# Patient Record
Sex: Female | Born: 1940 | Race: White | Hispanic: No | Marital: Married | Smoking: Former smoker
Health system: Southern US, Community
[De-identification: ages and names within clinical notes are randomized; demographics above are authoritative.]

## PROBLEM LIST (undated history)

## (undated) DIAGNOSIS — E039 Hypothyroidism, unspecified: Secondary | ICD-10-CM

## (undated) DIAGNOSIS — E7800 Pure hypercholesterolemia, unspecified: Secondary | ICD-10-CM

## (undated) DIAGNOSIS — F329 Major depressive disorder, single episode, unspecified: Secondary | ICD-10-CM

## (undated) DIAGNOSIS — IMO0001 Reserved for inherently not codable concepts without codable children: Secondary | ICD-10-CM

## (undated) DIAGNOSIS — K759 Inflammatory liver disease, unspecified: Secondary | ICD-10-CM

## (undated) DIAGNOSIS — G4730 Sleep apnea, unspecified: Secondary | ICD-10-CM

## (undated) DIAGNOSIS — M1711 Unilateral primary osteoarthritis, right knee: Secondary | ICD-10-CM

## (undated) DIAGNOSIS — R3915 Urgency of urination: Secondary | ICD-10-CM

## (undated) DIAGNOSIS — J189 Pneumonia, unspecified organism: Secondary | ICD-10-CM

## (undated) DIAGNOSIS — F32A Depression, unspecified: Secondary | ICD-10-CM

## (undated) DIAGNOSIS — I341 Nonrheumatic mitral (valve) prolapse: Secondary | ICD-10-CM

## (undated) DIAGNOSIS — N189 Chronic kidney disease, unspecified: Secondary | ICD-10-CM

## (undated) DIAGNOSIS — J329 Chronic sinusitis, unspecified: Secondary | ICD-10-CM

## (undated) DIAGNOSIS — K219 Gastro-esophageal reflux disease without esophagitis: Secondary | ICD-10-CM

## (undated) DIAGNOSIS — M84453A Pathological fracture, unspecified femur, initial encounter for fracture: Secondary | ICD-10-CM

## (undated) DIAGNOSIS — I10 Essential (primary) hypertension: Secondary | ICD-10-CM

## (undated) DIAGNOSIS — F419 Anxiety disorder, unspecified: Secondary | ICD-10-CM

## (undated) HISTORY — PX: SHOULDER SURGERY: SHX246

## (undated) HISTORY — PX: TOE SURGERY: SHX1073

## (undated) HISTORY — PX: KNEE ARTHROSCOPY W/ MENISCECTOMY: SHX1879

## (undated) HISTORY — PX: TONSILLECTOMY: SUR1361

## (undated) HISTORY — PX: COLONOSCOPY: SHX174

## (undated) HISTORY — PX: TUBAL LIGATION: SHX77

## (undated) HISTORY — PX: UVULOPALATOPHARYNGOPLASTY: SHX827

---

## 2000-01-07 ENCOUNTER — Encounter: Admission: RE | Admit: 2000-01-07 | Discharge: 2000-01-07 | Payer: Self-pay | Admitting: Family Medicine

## 2000-01-07 ENCOUNTER — Encounter: Payer: Self-pay | Admitting: Family Medicine

## 2002-02-14 ENCOUNTER — Encounter: Admission: RE | Admit: 2002-02-14 | Discharge: 2002-02-14 | Payer: Self-pay | Admitting: Family Medicine

## 2002-02-14 ENCOUNTER — Encounter: Payer: Self-pay | Admitting: Family Medicine

## 2003-10-11 ENCOUNTER — Ambulatory Visit (HOSPITAL_COMMUNITY): Admission: RE | Admit: 2003-10-11 | Discharge: 2003-10-11 | Payer: Self-pay | Admitting: Gastroenterology

## 2004-04-12 ENCOUNTER — Encounter: Admission: RE | Admit: 2004-04-12 | Discharge: 2004-04-12 | Payer: Self-pay | Admitting: Family Medicine

## 2005-05-22 ENCOUNTER — Encounter: Admission: RE | Admit: 2005-05-22 | Discharge: 2005-05-22 | Payer: Self-pay | Admitting: Family Medicine

## 2006-01-19 ENCOUNTER — Other Ambulatory Visit: Admission: RE | Admit: 2006-01-19 | Discharge: 2006-01-19 | Payer: Self-pay | Admitting: Family Medicine

## 2006-01-24 ENCOUNTER — Emergency Department (HOSPITAL_COMMUNITY): Admission: EM | Admit: 2006-01-24 | Discharge: 2006-01-24 | Payer: Self-pay | Admitting: Emergency Medicine

## 2006-05-28 ENCOUNTER — Encounter: Admission: RE | Admit: 2006-05-28 | Discharge: 2006-05-28 | Payer: Self-pay | Admitting: Family Medicine

## 2007-11-19 ENCOUNTER — Encounter: Admission: RE | Admit: 2007-11-19 | Discharge: 2007-11-19 | Payer: Self-pay | Admitting: Family Medicine

## 2010-05-22 ENCOUNTER — Emergency Department (HOSPITAL_COMMUNITY): Admission: EM | Admit: 2010-05-22 | Discharge: 2010-05-22 | Payer: Self-pay | Admitting: Emergency Medicine

## 2010-07-09 ENCOUNTER — Encounter: Admission: RE | Admit: 2010-07-09 | Discharge: 2010-07-09 | Payer: Self-pay | Admitting: Family Medicine

## 2010-10-05 ENCOUNTER — Encounter
Admission: RE | Admit: 2010-10-05 | Discharge: 2010-10-05 | Payer: Self-pay | Source: Home / Self Care | Attending: Neurological Surgery | Admitting: Neurological Surgery

## 2010-10-20 ENCOUNTER — Encounter: Payer: Self-pay | Admitting: Family Medicine

## 2010-12-13 LAB — POCT I-STAT, CHEM 8
BUN: 21 mg/dL (ref 6–23)
Calcium, Ion: 1.11 mmol/L — ABNORMAL LOW (ref 1.12–1.32)
Chloride: 104 meq/L (ref 96–112)
Creatinine, Ser: 1 mg/dL (ref 0.4–1.2)
Glucose, Bld: 105 mg/dL — ABNORMAL HIGH (ref 70–99)
HCT: 43 % (ref 36.0–46.0)
Hemoglobin: 14.6 g/dL (ref 12.0–15.0)
Potassium: 3.1 meq/L — ABNORMAL LOW (ref 3.5–5.1)
Sodium: 140 meq/L (ref 135–145)
TCO2: 25 mmol/L (ref 0–100)

## 2011-02-14 NOTE — Op Note (Signed)
NAME:  Faith Scott, Faith Scott                         ACCOUNT NO.:  [id number]   MEDICAL RECORD NO.:  [id number]                   PATIENT TYPE:  AMB   LOCATION:  ENDO                                 FACILITY:  [facility]   PHYSICIAN:  John C. Madilyn Fireman, M.D.                 DATE OF BIRTH:  1941/01/24   DATE OF PROCEDURE:  10/11/2003  DATE OF DISCHARGE:                                 OPERATIVE REPORT   PROCEDURE PERFORMED:  Esophagogastroduodenoscopy.   ENDOSCOPIST:  Barrie Folk, M.D.   INDICATIONS FOR PROCEDURE:  Solid food dysphagia.   DESCRIPTION OF PROCEDURE:  The patient was placed in the left lateral  decubitus position and placed on the pulse monitor with continuous low-flow  oxygen delivered by nasal cannula.  She was sedated with 50 mg IV Demerol  and 5 mg IV Versed.  The Olympus video endoscope was advanced under direct  vision into the oropharynx and esophagus.  The esophagus was straight and of  normal caliber with the squamocolumnar line at 38 cm.  There was no visible  hiatal hernia, ring, stricture or other abnormality of the gastroesophageal  junction.  The stomach was entered and a small amount of liquid secretions  was suctioned from the fundus.  A retroflex view of the cardia was  unremarkable.  The fundus, body, antrum and pylorus all appeared normal.  The duodenum was entered and both the bulb and second portion were well  inspected and appeared to be within normal limits.  A Savary guidewire was  placed through the endoscope channel, endoscope withdrawn.  A single 17 mm  Savary dilator was passed over the guidewire with minimal resistance and no  blood seen on withdrawal.  The last dilator was removed together with the  wire and the patient prepared for colonoscopy as previously scheduled.  The  patient tolerated the procedure well.  There were no immediate  complications.   IMPRESSION:  Normal endoscopy, status post empiric dilatation to 17 mm due  to solid food  dysphagia.   PLAN:  Advance diet and observe response to dilatation.                                               John C. Madilyn Fireman, M.D.    JCH/MEDQ  D:  10/11/2003  T:  10/11/2003  Job:  [id number]   cc:   Molly Maduro A. Nicholos Johns, M.D.  [address]  [place]  [place] [address]  Fax: [phone]

## 2011-02-14 NOTE — Op Note (Signed)
NAME:  Faith Scott, Faith Scott                         ACCOUNT NO.:  [id number]   MEDICAL RECORD NO.:  [id number]                   PATIENT TYPE:  AMB   LOCATION:  ENDO                                 FACILITY:  [facility]   PHYSICIAN:  John C. Madilyn Fireman, M.D.                 DATE OF BIRTH:  1941/02/12   DATE OF PROCEDURE:  10/11/2003  DATE OF DISCHARGE:                                 OPERATIVE REPORT   INDICATIONS FOR PROCEDURE:  Rectal bleeding in a 70 year old patient with no  recent colon screening.   DESCRIPTION OF PROCEDURE:  The patient was placed in the left lateral  decubitus position and placed on a pulse monitor with continuous low flow  oxygen delivered by nasal cannula.  She was sedated with 35 mcg of IV  fentanyl and 3 mg of IV Versed in addition to the medicine given for  previous EGD.  The Olympus video colonoscope was inserted into the rectum  and advanced to the cecum, confirmed by transillumination of McBurney's  point and visualization of the ileocecal valve and appendiceal orifice.  Prep was excellent.  The cecum, ascending, transverse, descending, and  sigmoid colon all appeared normal with no masses, polyps, diverticuli, or  other mucosal abnormalities.  The rectum likewise appeared normal on  retroflexed view.  The anus revealed some small internal hemorrhoids.  The  scope was then withdrawn and the patient returned to the recovery room in  stable condition.  She tolerated the procedure well and there were no  immediate complications.   IMPRESSION:  Internal hemorrhoids, otherwise normal study.   PLAN:  Next colon screening plus sigmoidoscopy in five years.  Treat  hemorrhoids symptomatically.                                               John C. Madilyn Fireman, M.D.    JCH/MEDQ  D:  10/11/2003  T:  10/11/2003  Job:  [id number]   cc:   Molly Maduro A. Nicholos Johns, M.D.  [address]  [place]  [place] [address]  Fax: [phone]

## 2011-09-30 HISTORY — PX: EYE SURGERY: SHX253

## 2012-02-12 ENCOUNTER — Other Ambulatory Visit (HOSPITAL_COMMUNITY)
Admission: RE | Admit: 2012-02-12 | Discharge: 2012-02-12 | Disposition: A | Discharge status: Home / Self Care | Payer: Medicare Other | Source: Ambulatory Visit | Attending: Family Medicine | Admitting: Family Medicine

## 2012-02-12 ENCOUNTER — Other Ambulatory Visit: Payer: Self-pay | Admitting: Family Medicine

## 2012-02-12 DIAGNOSIS — Z124 Encounter for screening for malignant neoplasm of cervix: Secondary | ICD-10-CM | POA: Insufficient documentation

## 2012-02-19 ENCOUNTER — Other Ambulatory Visit: Payer: Self-pay | Admitting: Family Medicine

## 2012-02-19 DIAGNOSIS — Z1231 Encounter for screening mammogram for malignant neoplasm of breast: Secondary | ICD-10-CM

## 2012-03-16 ENCOUNTER — Ambulatory Visit
Admission: RE | Admit: 2012-03-16 | Discharge: 2012-03-16 | Disposition: A | Discharge status: Home / Self Care | Payer: Medicare Other | Source: Ambulatory Visit | Attending: Family Medicine | Admitting: Family Medicine

## 2012-03-16 DIAGNOSIS — Z1231 Encounter for screening mammogram for malignant neoplasm of breast: Secondary | ICD-10-CM

## 2012-03-18 ENCOUNTER — Other Ambulatory Visit: Payer: Self-pay | Admitting: Family Medicine

## 2012-03-18 DIAGNOSIS — R928 Other abnormal and inconclusive findings on diagnostic imaging of breast: Secondary | ICD-10-CM

## 2012-03-29 ENCOUNTER — Ambulatory Visit
Admission: RE | Admit: 2012-03-29 | Discharge: 2012-03-29 | Disposition: A | Discharge status: Home / Self Care | Payer: Medicare Other | Source: Ambulatory Visit | Attending: Family Medicine | Admitting: Family Medicine

## 2012-03-29 DIAGNOSIS — R928 Other abnormal and inconclusive findings on diagnostic imaging of breast: Secondary | ICD-10-CM

## 2013-05-20 ENCOUNTER — Ambulatory Visit
Admission: RE | Admit: 2013-05-20 | Discharge: 2013-05-20 | Disposition: A | Discharge status: Home / Self Care | Payer: Medicare Other | Source: Ambulatory Visit | Attending: Internal Medicine | Admitting: Internal Medicine

## 2013-05-20 ENCOUNTER — Other Ambulatory Visit: Payer: Self-pay | Admitting: Internal Medicine

## 2013-05-20 DIAGNOSIS — J328 Other chronic sinusitis: Secondary | ICD-10-CM

## 2013-12-19 ENCOUNTER — Emergency Department (HOSPITAL_COMMUNITY)
Admission: EM | Admit: 2013-12-19 | Discharge: 2013-12-20 | Disposition: A | Discharge status: Home / Self Care | Payer: Medicare Other | Attending: Emergency Medicine | Admitting: Emergency Medicine

## 2013-12-19 ENCOUNTER — Encounter (HOSPITAL_COMMUNITY): Payer: Self-pay | Admitting: Emergency Medicine

## 2013-12-19 DIAGNOSIS — E039 Hypothyroidism, unspecified: Secondary | ICD-10-CM | POA: Insufficient documentation

## 2013-12-19 DIAGNOSIS — Z79899 Other long term (current) drug therapy: Secondary | ICD-10-CM | POA: Insufficient documentation

## 2013-12-19 DIAGNOSIS — H81399 Other peripheral vertigo, unspecified ear: Secondary | ICD-10-CM

## 2013-12-19 DIAGNOSIS — I10 Essential (primary) hypertension: Secondary | ICD-10-CM | POA: Insufficient documentation

## 2013-12-19 DIAGNOSIS — E7800 Pure hypercholesterolemia, unspecified: Secondary | ICD-10-CM | POA: Insufficient documentation

## 2013-12-19 DIAGNOSIS — F411 Generalized anxiety disorder: Secondary | ICD-10-CM | POA: Insufficient documentation

## 2013-12-19 HISTORY — DX: Pure hypercholesterolemia, unspecified: E78.00

## 2013-12-19 HISTORY — DX: Hypothyroidism, unspecified: E03.9

## 2013-12-19 HISTORY — DX: Essential (primary) hypertension: I10

## 2013-12-19 HISTORY — DX: Anxiety disorder, unspecified: F41.9

## 2013-12-19 LAB — CBC WITH DIFFERENTIAL/PLATELET
BASOS PCT: 0 % (ref 0–1)
Basophils Absolute: 0 10*3/uL (ref 0.0–0.1)
EOS ABS: 0.2 10*3/uL (ref 0.0–0.7)
Eosinophils Relative: 2 % (ref 0–5)
HCT: 39.3 % (ref 36.0–46.0)
Hemoglobin: 13.9 g/dL (ref 12.0–15.0)
LYMPHS ABS: 1.6 10*3/uL (ref 0.7–4.0)
Lymphocytes Relative: 20 % (ref 12–46)
MCH: 30.5 pg (ref 26.0–34.0)
MCHC: 35.4 g/dL (ref 30.0–36.0)
MCV: 86.2 fL (ref 78.0–100.0)
Monocytes Absolute: 0.6 10*3/uL (ref 0.1–1.0)
Monocytes Relative: 7 % (ref 3–12)
NEUTROS ABS: 5.6 10*3/uL (ref 1.7–7.7)
NEUTROS PCT: 71 % (ref 43–77)
PLATELETS: 187 10*3/uL (ref 150–400)
RBC: 4.56 MIL/uL (ref 3.87–5.11)
RDW: 13.2 % (ref 11.5–15.5)
WBC: 8 10*3/uL (ref 4.0–10.5)

## 2013-12-19 LAB — I-STAT TROPONIN, ED: Troponin i, poc: 0 ng/mL (ref 0.00–0.08)

## 2013-12-19 MED ORDER — SODIUM CHLORIDE 0.9 % IV SOLN
INTRAVENOUS | Status: DC
  Administered 2013-12-19: via INTRAVENOUS

## 2013-12-19 MED ORDER — DIPHENHYDRAMINE HCL 50 MG/ML IJ SOLN
25.0000 mg | Freq: Once | INTRAMUSCULAR | Status: AC
  Administered 2013-12-19: 25 mg via INTRAVENOUS
  Filled 2013-12-19: qty 1

## 2013-12-19 MED ORDER — ONDANSETRON HCL 4 MG/2ML IJ SOLN
4.0000 mg | Freq: Once | INTRAMUSCULAR | Status: AC
  Administered 2013-12-19: 4 mg via INTRAVENOUS
  Filled 2013-12-19: qty 2

## 2013-12-19 NOTE — ED Notes (Signed)
Pt to ED via EMS for evaluation of nausea and vomiting onset this evening, reports dizziness as well.  Pt vomiting upon arrival to ED, IV in place.

## 2013-12-19 NOTE — ED Provider Notes (Signed)
CSN: [id number]     Arrival date & time 12/19/13  2258 History   First MD Initiated Contact with Patient 12/19/13 2335     Chief Complaint  Patient presents with  . Nausea and Vomiting      (Consider location/radiation/quality/duration/timing/severity/associated sxs/prior Treatment) HPI This is a 73 year old female who developed sudden onset of nausea, vomiting and dizziness about 2 hours ago. On arrival she described the dizziness as the room spinning. She was also having difficulty with coordination. She had one loose stool that preceded the nausea and vomiting. She has no focal neurologic deficit. She was given Zofran IV on arrival but complains of continued nausea although the dizziness has improved.  Past Medical History  Diagnosis Date  . Hypertension   . Hypothyroid   . Hypercholesteremia   . Anxiety    Past Surgical History  Procedure Laterality Date  . Shoulder surgery    . Knee surgery     No family history on file. History  Substance Use Topics  . Smoking status: Not on file  . Smokeless tobacco: Not on file  . Alcohol Use: Not on file   OB History   Grav Para Term Preterm Abortions TAB SAB Ect Mult Living                 Review of Systems  All other systems reviewed and are negative.   Allergies  Calan and Lisinopril  Home Medications   Current Outpatient Rx  Name  Route  Sig  Dispense  Refill  . buPROPion (WELLBUTRIN XL) 150 MG 24 hr tablet   Oral   Take 150 mg by mouth daily.         Marland Kitchen. doxazosin (CARDURA) 4 MG tablet   Oral   Take 4 mg by mouth at bedtime.         . Estradiol-Estriol-Progesterone (BIEST/PROGESTERONE) CREA   Transdermal   Place 1 application onto the skin 2 (two) times daily.         Marland Kitchen. liothyronine (CYTOMEL) 5 MCG tablet   Oral   Take 5 mcg by mouth 2 (two) times daily.         Marland Kitchen. losartan-hydrochlorothiazide (HYZAAR) 100-25 MG per tablet   Oral   Take 1 tablet by mouth daily.          . Magnesium Hydroxide  (MAGNESIA PO)   Oral   Take 1 tablet by mouth 3 (three) times daily.         . Multiple Vitamin (MULTIVITAMIN WITH MINERALS) TABS tablet   Oral   Take 1 tablet by mouth daily.         . Nutritional Supplements (DHEA PO)   Oral   Take 5 mg by mouth daily.         Marland Kitchen. oxybutynin (DITROPAN) 5 MG tablet   Oral   Take 5 mg by mouth 3 (three) times daily.         . potassium chloride SA (K-DUR,KLOR-CON) 20 MEQ tablet   Oral   Take 20 mEq by mouth daily.         . simvastatin (ZOCOR) 40 MG tablet   Oral   Take 40 mg by mouth at bedtime.         . Thyroid (NATURE-THROID PO)   Oral   Take 2 tablets by mouth daily.         Marland Kitchen. VITAMIN D, CHOLECALCIFEROL, PO   Oral   Take 1 tablet by mouth daily.  BP 180/109  Pulse 61  Temp(Src) 97.4 F (36.3 C) (Oral)  Resp 17  SpO2 99%  Physical Exam General: Well-developed, well-nourished female in no acute distress; appearance consistent with age of record HENT: normocephalic; atraumatic Eyes: pupils equal, round and reactive to light; extraocular muscles intact; no nystagmus; arcus senilis bilaterally Neck: supple Heart: regular rate and rhythm Lungs: clear to auscultation bilaterally Abdomen: soft; nondistended; nontender; bowel sounds present Extremities: No deformity; full range of motion; pulses normal Neurologic: Awake, alert and oriented; motor function intact in all extremities and symmetric; no facial droop; normal coordination and speech; negative Romberg; normal finger to nose Skin: Warm and dry Psychiatric: Flat affect    ED Course  Procedures (including critical care time) Nursing notes and vitals signs, including pulse oximetry, reviewed.  Summary of this visit's results, reviewed by myself:  Labs:  Results for orders placed during the hospital encounter of 12/19/13 (from the past 24 hour(s))  CBC WITH DIFFERENTIAL     Status: None   Collection Time    12/19/13 11:35 PM      Result Value Ref  Range   WBC 8.0  4.0 - 10.5 K/uL   RBC 4.56  3.87 - 5.11 MIL/uL   Hemoglobin 13.9  12.0 - 15.0 g/dL   HCT 16.1  09.6 - 04.5 %   MCV 86.2  78.0 - 100.0 fL   MCH 30.5  26.0 - 34.0 pg   MCHC 35.4  30.0 - 36.0 g/dL   RDW 40.9  81.1 - 91.4 %   Platelets 187  150 - 400 K/uL   Neutrophils Relative % 71  43 - 77 %   Neutro Abs 5.6  1.7 - 7.7 K/uL   Lymphocytes Relative 20  12 - 46 %   Lymphs Abs 1.6  0.7 - 4.0 K/uL   Monocytes Relative 7  3 - 12 %   Monocytes Absolute 0.6  0.1 - 1.0 K/uL   Eosinophils Relative 2  0 - 5 %   Eosinophils Absolute 0.2  0.0 - 0.7 K/uL   Basophils Relative 0  0 - 1 %   Basophils Absolute 0.0  0.0 - 0.1 K/uL  COMPREHENSIVE METABOLIC PANEL     Status: Abnormal   Collection Time    12/19/13 11:35 PM      Result Value Ref Range   Sodium 144  137 - 147 mEq/L   Potassium 3.6 (*) 3.7 - 5.3 mEq/L   Chloride 103  96 - 112 mEq/L   CO2 27  19 - 32 mEq/L   Glucose, Bld 123 (*) 70 - 99 mg/dL   BUN 27 (*) 6 - 23 mg/dL   Creatinine, Ser 7.82  0.50 - 1.10 mg/dL   Calcium 9.1  8.4 - 95.6 mg/dL   Total Protein 6.3  6.0 - 8.3 g/dL   Albumin 3.6  3.5 - 5.2 g/dL   AST 23  0 - 37 U/L   ALT 20  0 - 35 U/L   Alkaline Phosphatase 61  39 - 117 U/L   Total Bilirubin 0.3  0.3 - 1.2 mg/dL   GFR calc non Af Amer 71 (*) >90 mL/min   GFR calc Af Amer 82 (*) >90 mL/min  LIPASE, BLOOD     Status: None   Collection Time    12/19/13 11:35 PM      Result Value Ref Range   Lipase 35  11 - 59 U/L  Rosezena Sensor, ED  Status: None   Collection Time    12/19/13 11:37 PM      Result Value Ref Range   Troponin i, poc 0.00  0.00 - 0.08 ng/mL   Comment 3            1:26 AM Patient symptoms much improved after IV Benadryl. Patient has been able to ambulate without difficulty. History and exam consistent with peripheral vertigo.     Hanley Seamen, MD 12/20/13 [phone]

## 2013-12-20 LAB — COMPREHENSIVE METABOLIC PANEL
ALBUMIN: 3.6 g/dL (ref 3.5–5.2)
ALK PHOS: 61 U/L (ref 39–117)
ALT: 20 U/L (ref 0–35)
AST: 23 U/L (ref 0–37)
BUN: 27 mg/dL — ABNORMAL HIGH (ref 6–23)
CO2: 27 mEq/L (ref 19–32)
Calcium: 9.1 mg/dL (ref 8.4–10.5)
Chloride: 103 mEq/L (ref 96–112)
Creatinine, Ser: 0.82 mg/dL (ref 0.50–1.10)
GFR calc Af Amer: 82 mL/min — ABNORMAL LOW (ref >90)
GFR calc non Af Amer: 71 mL/min — ABNORMAL LOW (ref >90)
Glucose, Bld: 123 mg/dL — ABNORMAL HIGH (ref 70–99)
POTASSIUM: 3.6 meq/L — AB (ref 3.7–5.3)
SODIUM: 144 meq/L (ref 137–147)
TOTAL PROTEIN: 6.3 g/dL (ref 6.0–8.3)
Total Bilirubin: 0.3 mg/dL (ref 0.3–1.2)

## 2013-12-20 LAB — LIPASE, BLOOD: Lipase: 35 U/L (ref 11–59)

## 2013-12-20 MED ORDER — MECLIZINE HCL 25 MG PO TABS: 25.0000 mg | ORAL_TABLET | Freq: Three times a day (TID) | ORAL | Status: DC | PRN

## 2013-12-20 NOTE — ED Notes (Signed)
Pt ambulatory to restroom without difficulty, denies nausea or dizziness

## 2013-12-20 NOTE — Discharge Instructions (Signed)
Vertigo Vertigo means you feel like you or your surroundings are moving when they are not. Vertigo can be dangerous if it occurs when you are at work, driving, or performing difficult activities.  CAUSES  Vertigo occurs when there is a conflict of signals sent to your brain from the visual and sensory systems in your body. There are many different causes of vertigo, including:  Infections, especially in the inner ear.  A bad reaction to a drug or misuse of alcohol and medicines.  Withdrawal from drugs or alcohol.  Rapidly changing positions, such as lying down or rolling over in bed.  A migraine headache.  Decreased blood flow to the brain.  Increased pressure in the brain from a head injury, infection, tumor, or bleeding. SYMPTOMS  You may feel as though the world is spinning around or you are falling to the ground. Because your balance is upset, vertigo can cause nausea and vomiting. You may have involuntary eye movements (nystagmus). DIAGNOSIS  Vertigo is usually diagnosed by physical exam. If the cause of your vertigo is unknown, your caregiver may perform imaging tests, such as an MRI scan (magnetic resonance imaging). TREATMENT  Most cases of vertigo resolve on their own, without treatment. Depending on the cause, your caregiver may prescribe certain medicines. If your vertigo is related to body position issues, your caregiver may recommend movements or procedures to correct the problem. In rare cases, if your vertigo is caused by certain inner ear problems, you may need surgery. HOME CARE INSTRUCTIONS   Follow your caregiver's instructions.  Avoid driving.  Avoid operating heavy machinery.  Avoid performing any tasks that would be dangerous to you or others during a vertigo episode.  Tell your caregiver if you notice that certain medicines seem to be causing your vertigo. Some of the medicines used to treat vertigo episodes can actually make them worse in some people. SEEK  IMMEDIATE MEDICAL CARE IF:   Your medicines do not relieve your vertigo or are making it worse.  You develop problems with talking, walking, weakness, or using your arms, hands, or legs.  You develop severe headaches.  Your nausea or vomiting continues or gets worse.  You develop visual changes.  A family member notices behavioral changes.  Your condition gets worse. MAKE SURE YOU:  Understand these instructions.  Will watch your condition.  Will get help right away if you are not doing well or get worse. Document Released: 06/25/2005 Document Revised: 12/08/2011 Document Reviewed: 04/03/2011 Exit[employer] Patient Information 2014 [employer].  

## 2014-07-05 ENCOUNTER — Emergency Department (HOSPITAL_COMMUNITY)
Admission: EM | Admit: 2014-07-05 | Discharge: 2014-07-05 | Disposition: A | Discharge status: Home / Self Care | Payer: Medicare Other | Attending: Emergency Medicine | Admitting: Emergency Medicine

## 2014-07-05 ENCOUNTER — Encounter (HOSPITAL_COMMUNITY): Payer: Self-pay | Admitting: Emergency Medicine

## 2014-07-05 ENCOUNTER — Emergency Department (HOSPITAL_COMMUNITY): Payer: Medicare Other

## 2014-07-05 DIAGNOSIS — R109 Unspecified abdominal pain: Secondary | ICD-10-CM | POA: Insufficient documentation

## 2014-07-05 DIAGNOSIS — Z79899 Other long term (current) drug therapy: Secondary | ICD-10-CM | POA: Diagnosis not present

## 2014-07-05 DIAGNOSIS — R110 Nausea: Secondary | ICD-10-CM | POA: Insufficient documentation

## 2014-07-05 DIAGNOSIS — E782 Mixed hyperlipidemia: Secondary | ICD-10-CM | POA: Insufficient documentation

## 2014-07-05 DIAGNOSIS — R0789 Other chest pain: Secondary | ICD-10-CM | POA: Diagnosis not present

## 2014-07-05 DIAGNOSIS — I10 Essential (primary) hypertension: Secondary | ICD-10-CM | POA: Diagnosis not present

## 2014-07-05 DIAGNOSIS — M549 Dorsalgia, unspecified: Secondary | ICD-10-CM | POA: Diagnosis not present

## 2014-07-05 DIAGNOSIS — Z87891 Personal history of nicotine dependence: Secondary | ICD-10-CM | POA: Insufficient documentation

## 2014-07-05 DIAGNOSIS — E039 Hypothyroidism, unspecified: Secondary | ICD-10-CM | POA: Diagnosis not present

## 2014-07-05 DIAGNOSIS — F419 Anxiety disorder, unspecified: Secondary | ICD-10-CM | POA: Insufficient documentation

## 2014-07-05 LAB — URINALYSIS, ROUTINE W REFLEX MICROSCOPIC
Bilirubin Urine: NEGATIVE
GLUCOSE, UA: NEGATIVE mg/dL
HGB URINE DIPSTICK: NEGATIVE
Ketones, ur: NEGATIVE mg/dL
Leukocytes, UA: NEGATIVE
Nitrite: NEGATIVE
PROTEIN: NEGATIVE mg/dL
Specific Gravity, Urine: 1.019 (ref 1.005–1.030)
Urobilinogen, UA: 0.2 mg/dL (ref 0.0–1.0)
pH: 6 (ref 5.0–8.0)

## 2014-07-05 LAB — COMPREHENSIVE METABOLIC PANEL
ALK PHOS: 66 U/L (ref 39–117)
ALT: 18 U/L (ref 0–35)
AST: 20 U/L (ref 0–37)
Albumin: 3.8 g/dL (ref 3.5–5.2)
Anion gap: 14 (ref 5–15)
BILIRUBIN TOTAL: 0.6 mg/dL (ref 0.3–1.2)
BUN: 22 mg/dL (ref 6–23)
CO2: 25 mEq/L (ref 19–32)
Calcium: 9 mg/dL (ref 8.4–10.5)
Chloride: 101 mEq/L (ref 96–112)
Creatinine, Ser: 0.72 mg/dL (ref 0.50–1.10)
GFR calc non Af Amer: 84 mL/min — ABNORMAL LOW (ref >90)
Glucose, Bld: 91 mg/dL (ref 70–99)
POTASSIUM: 3.2 meq/L — AB (ref 3.7–5.3)
Sodium: 140 mEq/L (ref 137–147)
TOTAL PROTEIN: 7.1 g/dL (ref 6.0–8.3)

## 2014-07-05 LAB — CBC WITH DIFFERENTIAL/PLATELET
Basophils Absolute: 0 10*3/uL (ref 0.0–0.1)
Basophils Relative: 1 % (ref 0–1)
EOS ABS: 0.2 10*3/uL (ref 0.0–0.7)
Eosinophils Relative: 3 % (ref 0–5)
HEMATOCRIT: 41.5 % (ref 36.0–46.0)
HEMOGLOBIN: 14.7 g/dL (ref 12.0–15.0)
Lymphocytes Relative: 29 % (ref 12–46)
Lymphs Abs: 1.5 10*3/uL (ref 0.7–4.0)
MCH: 29.9 pg (ref 26.0–34.0)
MCHC: 35.4 g/dL (ref 30.0–36.0)
MCV: 84.3 fL (ref 78.0–100.0)
MONO ABS: 0.3 10*3/uL (ref 0.1–1.0)
MONOS PCT: 7 % (ref 3–12)
Neutro Abs: 3.2 10*3/uL (ref 1.7–7.7)
Neutrophils Relative %: 60 % (ref 43–77)
Platelets: 202 10*3/uL (ref 150–400)
RBC: 4.92 MIL/uL (ref 3.87–5.11)
RDW: 12.7 % (ref 11.5–15.5)
WBC: 5.3 10*3/uL (ref 4.0–10.5)

## 2014-07-05 LAB — LIPASE, BLOOD: LIPASE: 40 U/L (ref 11–59)

## 2014-07-05 LAB — I-STAT CG4 LACTIC ACID, ED: Lactic Acid, Venous: 1.47 mmol/L (ref 0.5–2.2)

## 2014-07-05 MED ORDER — MORPHINE SULFATE 4 MG/ML IJ SOLN
4.0000 mg | Freq: Once | INTRAMUSCULAR | Status: AC
  Administered 2014-07-05: 4 mg via INTRAVENOUS
  Filled 2014-07-05: qty 1

## 2014-07-05 MED ORDER — ACYCLOVIR 800 MG PO TABS: 800.0000 mg | ORAL_TABLET | Freq: Every day | ORAL | Status: DC

## 2014-07-05 MED ORDER — ONDANSETRON HCL 4 MG/2ML IJ SOLN
4.0000 mg | Freq: Once | INTRAMUSCULAR | Status: AC
  Administered 2014-07-05: 4 mg via INTRAVENOUS
  Filled 2014-07-05: qty 2

## 2014-07-05 MED ORDER — OXYCODONE-ACETAMINOPHEN 5-325 MG PO TABS: 1.0000 | ORAL_TABLET | Freq: Four times a day (QID) | ORAL | Status: DC | PRN

## 2014-07-05 NOTE — ED Notes (Signed)
MD at bedside. 

## 2014-07-05 NOTE — ED Notes (Signed)
MD at bedside. EDP HORTON 

## 2014-07-05 NOTE — ED Notes (Addendum)
Pt c/o LUQ abdominal pain radiating into back and L shoulder blade, SOB, and intermittent nausea x 4 days.  Pain score 5/10.  Pt reports pain upon palpation.  Pt reports lying flat makes it "a little better."

## 2014-07-05 NOTE — Discharge Instructions (Signed)
The exact cause of your pain is unknown at this time.  You have a few lesions on your side that could be early shingles - but not diagnostic.   You need to follow-up with your doctor in 1-2 days if symptoms worsen or rash appears.  Flank Pain Flank pain refers to pain that is located on the side of the body between the upper abdomen and the back. The pain may occur over a short period of time (acute) or may be long-term or reoccurring (chronic). It may be mild or severe. Flank pain can be caused by many things. CAUSES  Some of the more common causes of flank pain include:  Muscle strains.   Muscle spasms.   A disease of your spine (vertebral disk disease).   A lung infection (pneumonia).   Fluid around your lungs (pulmonary edema).   A kidney infection.   Kidney stones.   A very painful skin rash caused by the chickenpox virus (shingles).   Gallbladder disease.  HOME CARE INSTRUCTIONS  Home care will depend on the cause of your pain. In general,  Rest as directed by your caregiver.  Drink enough fluids to keep your urine clear or pale yellow.  Only take over-the-counter or prescription medicines as directed by your caregiver. Some medicines may help relieve the pain.  Tell your caregiver about any changes in your pain.  Follow up with your caregiver as directed. SEEK IMMEDIATE MEDICAL CARE IF:   Your pain is not controlled with medicine.   You have new or worsening symptoms.  Your pain increases.   You have abdominal pain.   You have shortness of breath.   You have persistent nausea or vomiting.   You have swelling in your abdomen.   You feel faint or pass out.   You have blood in your urine.  You have a fever or persistent symptoms for more than 2-3 days.  You have a fever and your symptoms suddenly get worse. MAKE SURE YOU:   Understand these instructions.  Will watch your condition.  Will get help right away if you are not doing well  or get worse. Document Released: 11/06/2005 Document Revised: 06/09/2012 Document Reviewed: 04/29/2012 [facility] Patient Information 2015 [employer], [place]. This information is not intended to replace advice given to you by your health care provider. Make sure you discuss any questions you have with your health care provider.

## 2014-07-05 NOTE — ED Notes (Signed)
Patient transported to X-ray 

## 2014-07-05 NOTE — ED Provider Notes (Signed)
CSN: [id number]     Arrival date & time 07/05/14  [id number] History   First MD Initiated Contact with Patient 07/05/14 1009     Chief Complaint  Patient presents with  . Abdominal Pain  . Shortness of Breath     (Consider location/radiation/quality/duration/timing/severity/associated sxs/prior Treatment) HPI  This is a 73 year old female with a history of hypertension, hypercholesterolemia who presents with a 3 to four-day history of worsening left chest, flank, and back pain. She reports sharp and mostly in her left upper quadrant and lower chest. It radiates around her back. She does not not note any association with food, exertion, or breathing. Nothing makes it better or worse but she states that even her shirt touching this area increases the pain. She hasn't tried anything at home. She denies any shortness of breath, diarrhea, vomiting. She does report "waves of nausea." She reports the pain is constant. Currently it is 4/10 but it does worsen at times. She denies any injury. She has not noted any rash. Denies any urinary symptoms.  Past Medical History  Diagnosis Date  . Hypertension   . Hypothyroid   . Hypercholesteremia   . Anxiety    Past Surgical History  Procedure Laterality Date  . Shoulder surgery    . Knee surgery     History reviewed. No pertinent family history. History  Substance Use Topics  . Smoking status: Former Games developermoker  . Smokeless tobacco: Not on file  . Alcohol Use: Yes   OB History   Grav Para Term Preterm Abortions TAB SAB Ect Mult Living                 Review of Systems  Constitutional: Negative for fever.  Respiratory: Positive for chest tightness. Negative for cough and shortness of breath.   Cardiovascular: Positive for chest pain. Negative for leg swelling.  Gastrointestinal: Positive for nausea. Negative for vomiting, abdominal pain, diarrhea and constipation.  Genitourinary: Negative for dysuria.  Musculoskeletal: Positive for back pain.   Skin: Negative for rash.  Neurological: Negative for headaches.  All other systems reviewed and are negative.     Allergies  Calan and Lisinopril  Home Medications   Prior to Admission medications   Medication Sig Start Date End Date Taking? Authorizing Provider  5-HYDROXYTRYPTOPHAN PO Take 1 tablet by mouth daily.    Yes Historical Provider, MD  amLODipine (NORVASC) 5 MG tablet Take 5 mg by mouth daily.   Yes Historical Provider, MD  Biotin 10 MG CAPS Take 10 mg by mouth daily.   Yes Historical Provider, MD  doxazosin (CARDURA) 4 MG tablet Take 4 mg by mouth at bedtime.   Yes Historical Provider, MD  Estradiol-Estriol-Progesterone (BIEST/PROGESTERONE) CREA Place 1 application onto the skin 2 (two) times daily.   Yes Historical Provider, MD  GLYCINE PO Take 1 capsule by mouth daily.    Yes Historical Provider, MD  liothyronine (CYTOMEL) 5 MCG tablet Take 5 mcg by mouth daily.   Yes Historical Provider, MD  losartan-hydrochlorothiazide (HYZAAR) 100-25 MG per tablet Take 1 tablet by mouth daily.  10/13/13  Yes Historical Provider, MD  MAGNESIUM GLYCINATE PLUS PO Take 1 tablet by mouth daily.   Yes Historical Provider, MD  Multiple Vitamin (MULTIVITAMIN WITH MINERALS) TABS tablet Take 1 tablet by mouth daily.   Yes Historical Provider, MD  Nutritional Supplements (DHEA PO) Take 5 mg by mouth daily.   Yes Historical Provider, MD  oxybutynin (DITROPAN) 5 MG tablet Take 5 mg by mouth 3 (  three) times daily.   Yes Historical Provider, MD  potassium chloride SA (K-DUR,KLOR-CON) 20 MEQ tablet Take 20 mEq by mouth daily.   Yes Historical Provider, MD  Selenium (SELENOMAX PO) Take 1 tablet by mouth daily.    Yes Historical Provider, MD  simvastatin (ZOCOR) 40 MG tablet Take 40 mg by mouth at bedtime.   Yes Historical Provider, MD  Thyroid (NATURE-THROID PO) Take 2 tablets by mouth daily.   Yes Historical Provider, MD  VITAMIN D, CHOLECALCIFEROL, PO Take 1 tablet by mouth daily.   Yes Historical  Provider, MD  acyclovir (ZOVIRAX) 800 MG tablet Take 1 tablet (800 mg total) by mouth 5 (five) times daily. 07/05/14   Shon Baton, MD  oxyCODONE-acetaminophen (PERCOCET/ROXICET) 5-325 MG per tablet Take 1 tablet by mouth every 6 (six) hours as needed for moderate pain or severe pain. 07/05/14   Shon Baton, MD   BP 150/93  Pulse 64  Temp(Src) 97.7 F (36.5 C) (Oral)  Resp 16  SpO2 97% Physical Exam  Nursing note and vitals reviewed. Constitutional: She is oriented to person, place, and time. She appears well-developed and well-nourished. No distress.  HENT:  Head: Normocephalic and atraumatic.  Eyes: Pupils are equal, round, and reactive to light.  Cardiovascular: Normal rate, regular rhythm and normal heart sounds.   No murmur heard. Pulmonary/Chest: Effort normal and breath sounds normal. No respiratory distress. She has no wheezes.  Abdominal: Soft. Bowel sounds are normal. There is tenderness. There is no rebound and no guarding.  Tenderness to palpation in the left upper quadrant and left flank, no signs of peritonitis  Neurological: She is alert and oriented to person, place, and time.  Skin: Skin is warm and dry.  6 punctate areas of erythema noted in the distribution of the tenderness over the left upper quadrant, no vesicles noted, no lesions noted on the back  Psychiatric: She has a normal mood and affect.    ED Course  Procedures (including critical care time) Labs Review Labs Reviewed  COMPREHENSIVE METABOLIC PANEL - Abnormal; Notable for the following:    Potassium 3.2 (*)    GFR calc non Af Amer 84 (*)    All other components within normal limits  CBC WITH DIFFERENTIAL  LIPASE, BLOOD  URINALYSIS, ROUTINE W REFLEX MICROSCOPIC  I-STAT CG4 LACTIC ACID, ED  I-STAT TROPOININ, ED    Imaging Review Dg Abd Acute W/chest  07/05/2014   CLINICAL DATA:  Left upper quadrant pain. Shortness of breath. Intermittent nausea.  EXAM: ACUTE ABDOMEN SERIES (ABDOMEN 2  VIEW & CHEST 1 VIEW)  COMPARISON:  Chest x-ray 07/21/2011.  FINDINGS: Mild left lower lobe infiltrate cannot be excluded. Chest is otherwise unremarkable.  Soft tissue structures of the abdomen are unremarkable. Gas pattern nonspecific. Stool noted throughout the colon. Calcified pelvic densities consistent with phleboliths. Distal ureteral stone cannot be excluded. Aortoiliac atherosclerotic vascular disease. Degenerative changes lumbar spine and both hips.  IMPRESSION: 1. Mild left lower lobe pulmonary infiltrate cannot be excluded. 2. No acute intra-abdominal abnormality.   Electronically Signed   By: Maisie Fus  Register   On: 07/05/2014 12:09     EKG Interpretation   Date/Time:  Wednesday July 05 2014 10:15:36 EDT Ventricular Rate:  62 PR Interval:  162 QRS Duration: 99 QT Interval:  447 QTC Calculation: 454 R Axis:   -53 Text Interpretation:  Sinus rhythm Left axis deviation RSR' in V1 or V2,  right VCD or RVH No prior for comparison Confirmed by [facility]  MD, Toni Amend  ([id number]) on 07/05/2014 10:38:55 AM      MDM   Final diagnoses:  Flank pain   Patient presents with left-sided flank and chest pain. Vital signs are reassuring. Pain is reproducible on exam. She does have several small areas of punctate erythema in a dermatomal distribution. While these areas are not vesicular, physical exam and history are suspicious for prodrome for shingles. EKG, chest x-ray obtained and largely reassuring. There is a questionable left lower lobe infiltrate. Patient has not had any fevers or cough and clinically I have low suspicion for pneumonia.  All the workup is negative. Discuss with patient that this could be early shingles and she should monitor for worsening pain or rash. She was given pain medication. Acyclovir in the event that she does develop a more prominent rash as this will need to be started as soon as possible for best effect. She will followup with her primary care physician by  Friday.  After history, exam, and medical workup I feel the patient has been appropriately medically screened and is safe for discharge home. Pertinent diagnoses were discussed with the patient. Patient was given return precautions.     Shon Baton, MD 07/06/14 [phone]

## 2015-01-25 ENCOUNTER — Ambulatory Visit: Payer: Medicare Other | Admitting: Podiatry

## 2015-03-07 ENCOUNTER — Encounter (HOSPITAL_COMMUNITY): Payer: Self-pay | Admitting: Physician Assistant

## 2015-03-07 DIAGNOSIS — I10 Essential (primary) hypertension: Secondary | ICD-10-CM | POA: Diagnosis present

## 2015-03-07 DIAGNOSIS — F419 Anxiety disorder, unspecified: Secondary | ICD-10-CM | POA: Diagnosis present

## 2015-03-07 DIAGNOSIS — E7800 Pure hypercholesterolemia, unspecified: Secondary | ICD-10-CM | POA: Diagnosis present

## 2015-03-07 DIAGNOSIS — M1711 Unilateral primary osteoarthritis, right knee: Secondary | ICD-10-CM | POA: Diagnosis present

## 2015-03-07 NOTE — H&P (Addendum)
TOTAL KNEE ADMISSION H&P  Patient is being admitted for right total knee arthroplasty.  Subjective:  Chief Complaint:right knee pain.  HPI: Faith Scott, 74 y.o. female, has a history of pain and functional disability in the right knee due to arthritis and has failed non-surgical conservative treatments for greater than 12 weeks to includeNSAID's and/or analgesics, corticosteriod injections, viscosupplementation injections, flexibility and strengthening excercises, supervised PT with diminished ADL's post treatment, use of assistive devices and activity modification.  Onset of symptoms was gradual, starting 10 years ago with gradually worsening course since that time. The patient noted prior procedures on the knee to include  arthroscopy and menisectomy on the right knee(s).  Patient currently rates pain in the right knee(s) at 10 out of 10 with activity. Patient has night pain, worsening of pain with activity and weight bearing, pain that interferes with activities of daily living, crepitus and joint swelling.  Patient has evidence of subchondral sclerosis, periarticular osteophytes and joint space narrowing by imaging studies. There is no active infection.  Patient Active Problem List   Diagnosis Date Noted  . Primary localized osteoarthritis of right knee   . Anxiety   . Hypercholesteremia   . Hypertension    Past Medical History  Diagnosis Date  . Hypertension   . Hypothyroid     resolved as of 03/07/15 no medications anymore  . Hypercholesteremia   . Anxiety   . Primary localized osteoarthritis of right knee     Past Surgical History  Procedure Laterality Date  . Shoulder surgery Left   . Knee arthroscopy w/ meniscectomy Right   . Shoulder surgery Right   . Tubal ligation    . Uvulopalatopharyngoplasty      no CPAP needed now    No current facility-administered medications for this encounter.  Current outpatient prescriptions:  .  5-HYDROXYTRYPTOPHAN PO, Take 1 tablet by  mouth daily. , Disp: , Rfl:  .  Biotin 10 MG CAPS, Take 10 mg by mouth daily., Disp: , Rfl:  .  doxazosin (CARDURA) 4 MG tablet, Take 4 mg by mouth at bedtime., Disp: , Rfl:  .  Estradiol-Estriol-Progesterone (BIEST/PROGESTERONE) CREA, Place 1 application onto the skin 2 (two) times daily., Disp: , Rfl:  .  GLYCINE PO, Take 1 capsule by mouth daily. , Disp: , Rfl:  .  losartan-hydrochlorothiazide (HYZAAR) 100-25 MG per tablet, Take 1 tablet by mouth daily. , Disp: , Rfl:  .  MAGNESIUM GLYCINATE PLUS PO, Take 1 tablet by mouth daily., Disp: , Rfl:  .  Multiple Vitamin (MULTIVITAMIN WITH MINERALS) TABS tablet, Take 1 tablet by mouth daily., Disp: , Rfl:  .  Nutritional Supplements (DHEA PO), Take 5 mg by mouth daily., Disp: , Rfl:  .  oxybutynin (DITROPAN) 5 MG tablet, Take 5 mg by mouth 3 (three) times daily., Disp: , Rfl:  .  pantoprazole (PROTONIX) 40 MG tablet, Take 40 mg by mouth every morning., Disp: , Rfl: 5 .  Probiotic Product (PROBIOTIC & ACIDOPHILUS EX ST PO), Take 1 tablet by mouth., Disp: , Rfl:  .  ranitidine (ZANTAC) 300 MG tablet, Take 300 mg by mouth at bedtime., Disp: , Rfl: 5 .  vitamin B-12 (CYANOCOBALAMIN) 1000 MCG tablet, Take 1,000 mcg by mouth daily., Disp: , Rfl:  .  VITAMIN D, CHOLECALCIFEROL, PO, Take 1 tablet by mouth daily., Disp: , Rfl:  .  acyclovir (ZOVIRAX) 800 MG tablet, Take 1 tablet (800 mg total) by mouth 5 (five) times daily., Disp: 50 tablet, Rfl:  0 .  oxyCODONE-acetaminophen (PERCOCET/ROXICET) 5-325 MG per tablet, Take 1 tablet by mouth every 6 (six) hours as needed for moderate pain or severe pain., Disp: 15 tablet, Rfl: 0 .  potassium chloride SA (K-DUR,KLOR-CON) 20 MEQ tablet, Take 20 mEq by mouth daily., Disp: , Rfl:  Allergies  Allergen Reactions  . Calan [Verapamil] Other (See Comments)    Hair loss  . Lisinopril Cough    History  Substance Use Topics  . Smoking status: Former Games developer  . Smokeless tobacco: Not on file  . Alcohol Use: 0.0 oz/week     0 Standard drinks or equivalent per week     Comment: 2 times a month    Family History  Problem Relation Age of Onset  . Hypertension Mother   . Stroke Mother   . Clotting disorder Sister     sister died at age 24 of a postoperative blood clot  . Clotting disorder Mother     mother had a stoke postopertively     Review of Systems  Constitutional: Negative.   HENT: Positive for congestion. Negative for ear discharge, ear pain, hearing loss, nosebleeds, sore throat and tinnitus.   Eyes: Negative.   Respiratory: Negative.  Negative for stridor.   Cardiovascular: Negative.   Gastrointestinal: Negative.   Genitourinary: Negative.   Musculoskeletal: Positive for joint pain.       Right knee  Skin: Negative.   Neurological: Negative.  Negative for headaches.  Endo/Heme/Allergies: Negative.   Psychiatric/Behavioral: Negative.     Objective:  Physical Exam  Constitutional: She is oriented to person, place, and time. She appears well-developed and well-nourished.  HENT:  Head: Normocephalic and atraumatic.  Mouth/Throat: Oropharynx is clear and moist.  Eyes: Conjunctivae and EOM are normal. Pupils are equal, round, and reactive to light.  Neck: Neck supple.  Cardiovascular: Normal rate and regular rhythm.   Respiratory: Effort normal and breath sounds normal.  GI: Soft. Bowel sounds are normal.  Genitourinary:  Not pertinent to current symptomatology therefore not examined.  Musculoskeletal:  Examination of her right knee reveals a moderate valgus deformity. Diffuse pain. There is 1+ crepitation. Full range of motion.  The knee is stable with normal patella tracking. Examination of the left knee reveals a full range of motion without pain, swelling weakness or instability. Vascular exam: pulses 2+ and symmetric.   Neurological: She is alert and oriented to person, place, and time.  Skin: Skin is warm and dry.  Psychiatric: She has a normal mood and affect. Her behavior is  normal.    Vital signs in last 24 hours: Temp:  [98.9 F (37.2 C)] 98.9 F (37.2 C) (06/08 1400) Pulse Rate:  [77] 77 (06/08 1400) BP: (109)/(74) 109/74 mmHg (06/08 1400) SpO2:  [95 %] 95 % (06/08 1400) Weight:  [83.915 kg (185 lb)] 83.915 kg (185 lb) (06/08 1400)  Labs:   Estimated body mass index is 31.74 kg/(m^2) as calculated from the following:   Height as of this encounter:  (1.626 m).   Weight as of this encounter: 83.915 kg (185 lb).   Imaging Review Plain radiographs demonstrate severe degenerative joint disease of the right knee(s). The overall alignment issignificant valgus. The bone quality appears to be good for age and reported activity level.  Assessment/Plan:  End stage arthritis, right knee  Active Problems:   Primary localized osteoarthritis of right knee   Anxiety   Hypercholesteremia   Hypertension    The patient history, physical examination, clinical judgment of  the provider and imaging studies are consistent with end stage degenerative joint disease of the right knee(s) and total knee arthroplasty is deemed medically necessary. The treatment options including medical management, injection therapy arthroscopy and arthroplasty were discussed at length. The risks and benefits of total knee arthroplasty were presented and reviewed. The risks due to aseptic loosening, infection, stiffness, patella tracking problems, thromboembolic complications and other imponderables were discussed. The patient acknowledged the explanation, agreed to proceed with the plan and consent was signed. Patient is being admitted for inpatient treatment for surgery, pain control, PT, OT, prophylactic antibiotics, VTE prophylaxis, progressive ambulation and ADL's and discharge planning. The patient is planning to be discharged home with home health services   Itzamar Traynor A. Gwinda PasseShepperson, PA-C Physician Assistant [facility] [phone]  03/07/2015, 2:44 PM

## 2015-03-08 ENCOUNTER — Encounter (HOSPITAL_COMMUNITY): Payer: Self-pay

## 2015-03-08 ENCOUNTER — Encounter (HOSPITAL_COMMUNITY)
Admission: RE | Admit: 2015-03-08 | Discharge: 2015-03-08 | Disposition: A | Discharge status: Home / Self Care | Payer: Medicare Other | Source: Ambulatory Visit | Attending: Orthopedic Surgery | Admitting: Orthopedic Surgery

## 2015-03-08 ENCOUNTER — Encounter (HOSPITAL_COMMUNITY): Payer: Self-pay | Admitting: Emergency Medicine

## 2015-03-08 DIAGNOSIS — M179 Osteoarthritis of knee, unspecified: Secondary | ICD-10-CM

## 2015-03-08 DIAGNOSIS — Z01818 Encounter for other preprocedural examination: Secondary | ICD-10-CM | POA: Insufficient documentation

## 2015-03-08 HISTORY — DX: Sleep apnea, unspecified: G47.30

## 2015-03-08 HISTORY — DX: Pneumonia, unspecified organism: J18.9

## 2015-03-08 HISTORY — DX: Reserved for inherently not codable concepts without codable children: IMO0001

## 2015-03-08 HISTORY — DX: Chronic sinusitis, unspecified: J32.9

## 2015-03-08 HISTORY — DX: Urgency of urination: R39.15

## 2015-03-08 HISTORY — DX: Gastro-esophageal reflux disease without esophagitis: K21.9

## 2015-03-08 HISTORY — DX: Inflammatory liver disease, unspecified: K75.9

## 2015-03-08 HISTORY — DX: Chronic kidney disease, unspecified: N18.9

## 2015-03-08 HISTORY — DX: Nonrheumatic mitral (valve) prolapse: I34.1

## 2015-03-08 LAB — URINALYSIS, ROUTINE W REFLEX MICROSCOPIC
BILIRUBIN URINE: NEGATIVE
GLUCOSE, UA: NEGATIVE mg/dL
Hgb urine dipstick: NEGATIVE
Ketones, ur: NEGATIVE mg/dL
Leukocytes, UA: NEGATIVE
Nitrite: NEGATIVE
PH: 6 (ref 5.0–8.0)
Protein, ur: NEGATIVE mg/dL
Specific Gravity, Urine: 1.024 (ref 1.005–1.030)
Urobilinogen, UA: 0.2 mg/dL (ref 0.0–1.0)

## 2015-03-08 LAB — CBC WITH DIFFERENTIAL/PLATELET
Basophils Absolute: 0 10*3/uL (ref 0.0–0.1)
Basophils Relative: 1 % (ref 0–1)
EOS PCT: 3 % (ref 0–5)
Eosinophils Absolute: 0.2 10*3/uL (ref 0.0–0.7)
HCT: 41.3 % (ref 36.0–46.0)
Hemoglobin: 14.8 g/dL (ref 12.0–15.0)
LYMPHS ABS: 1.4 10*3/uL (ref 0.7–4.0)
Lymphocytes Relative: 25 % (ref 12–46)
MCH: 30 pg (ref 26.0–34.0)
MCHC: 35.8 g/dL (ref 30.0–36.0)
MCV: 83.8 fL (ref 78.0–100.0)
Monocytes Absolute: 0.4 10*3/uL (ref 0.1–1.0)
Monocytes Relative: 8 % (ref 3–12)
Neutro Abs: 3.6 10*3/uL (ref 1.7–7.7)
Neutrophils Relative %: 63 % (ref 43–77)
Platelets: 197 10*3/uL (ref 150–400)
RBC: 4.93 MIL/uL (ref 3.87–5.11)
RDW: 13.5 % (ref 11.5–15.5)
WBC: 5.6 10*3/uL (ref 4.0–10.5)

## 2015-03-08 LAB — COMPREHENSIVE METABOLIC PANEL
ALT: 25 U/L (ref 14–54)
AST: 29 U/L (ref 15–41)
Albumin: 4 g/dL (ref 3.5–5.0)
Alkaline Phosphatase: 65 U/L (ref 38–126)
Anion gap: 11 (ref 5–15)
BUN: 20 mg/dL (ref 6–20)
CHLORIDE: 104 mmol/L (ref 101–111)
CO2: 25 mmol/L (ref 22–32)
Calcium: 9.3 mg/dL (ref 8.9–10.3)
Creatinine, Ser: 0.9 mg/dL (ref 0.44–1.00)
GFR calc non Af Amer: >60 mL/min (ref >60)
Glucose, Bld: 95 mg/dL (ref 65–99)
Potassium: 3.4 mmol/L — ABNORMAL LOW (ref 3.5–5.1)
Sodium: 140 mmol/L (ref 135–145)
TOTAL PROTEIN: 6.8 g/dL (ref 6.5–8.1)
Total Bilirubin: 1.2 mg/dL (ref 0.3–1.2)

## 2015-03-08 LAB — TYPE AND SCREEN
ABO/RH(D): A POS
Antibody Screen: NEGATIVE

## 2015-03-08 LAB — PROTIME-INR
INR: 1.02 (ref 0.00–1.49)
Prothrombin Time: 13.6 seconds (ref 11.6–15.2)

## 2015-03-08 LAB — SURGICAL PCR SCREEN
MRSA, PCR: NEGATIVE
STAPHYLOCOCCUS AUREUS: NEGATIVE

## 2015-03-08 LAB — ABO/RH: ABO/RH(D): A POS

## 2015-03-08 LAB — APTT: aPTT: 29 seconds (ref 24–37)

## 2015-03-08 NOTE — Progress Notes (Signed)
Patient informed Nurse that she had a stress test at [facility] in March 2016. Will request records. Patient denied having a cardiac cath, chest discomfort or any acute shortness of breath. PCP is Elias Else.

## 2015-03-08 NOTE — Pre-Procedure Instructions (Signed)
   SWAYZEE HOWE  03/08/2015     Your procedure is scheduled on: Monday March 19, 2015 at 7:15 AM.  Report to [facility] Admitting at 5:30 A.M.  Call this number if you have problems the morning of surgery: [phone]    Remember:  Do not eat food or drink liquids after midnight.  Take these medicines the morning of surgery with A SIP OF WATER : Oxybutynin (Ditropan), Pantoprazole (Protonix)   Please stop taking any vitamins, herbal medications, Ibuprofen, Advil, Motrin, Aleve, etc on Monday June 13th   Do not wear jewelry, make-up or nail polish.  Do not wear lotions, powders, or perfumes.    Do not shave 48 hours prior to surgery.    Do not bring valuables to the hospital.  [facility] is not responsible for any belongings or valuables.  Contacts, dentures or bridgework may not be worn into surgery.  Leave your suitcase in the car.  After surgery it may be brought to your room.  For patients admitted to the hospital, discharge time will be determined by your treatment team.  Patients discharged the day of surgery will not be allowed to drive home.   Name and phone number of your driver:    Special instructions:  Shower using CHG soap the night before and the morning of your surgery  Please read over the following fact sheets that you were given. Pain Booklet, Coughing and Deep Breathing, Blood Transfusion Information, Total Joint Packet, MRSA Information and Surgical Site Infection Prevention

## 2015-03-08 NOTE — Progress Notes (Signed)
Anesthesia Chart Review:  Pt is 74 year old female scheduled for R total knee arthroplasty on 03/19/2015 with Dr. Thurston Hole.   PCP is Dr. Elias Else.   PMH includes: HTN, MVP, OSA (no longer has s/p uvulopalatopharyngoplasty), hypothyroidism (resolved-no longer takes medication), CKD (stage 2), GERD, hypercholesterolemia. Former smoker. BMI 33.5  Medications include: doxazosin, losartan-hctz, protonix.   Preoperative labs reviewed.    1 view CXR 12/11/14 reviewed: No active disease.   EKG 12/11/2014: NSR. Inferior-posterior infarct, age undetermined.   Nuclear stress test 12/12/2014: 1. Normal nuclear stress test 2. Normal EF calculated to be 73% 3. Normal gated images.   If no changes, I anticipate pt can proceed with surgery as scheduled.   Rica Mast, FNP-BC [facility]/Anesthesiology Phone: [phone] 03/08/2015 4:37 PM

## 2015-03-08 NOTE — Progress Notes (Signed)
   03/08/15 1027  OBSTRUCTIVE SLEEP APNEA  Have you ever been diagnosed with sleep apnea through a sleep study? No  If yes, do you have and use a CPAP or BPAP machine every night? 0  Do you snore loudly (loud enough to be heard through closed doors)?  1  Do you often feel tired, fatigued, or sleepy during the daytime? 0  Has anyone observed you stop breathing during your sleep? 1  Do you have, or are you being treated for high blood pressure? 1  BMI more than 35 kg/m2? 0  Age over 12 years old? 1  Neck circumference greater than 40 cm/16 inches? 0  Gender: 0  Obstructive Sleep Apnea Score 4   This patient has screened at risk for sleep apnea using the STOP bang tool used during a pre-surgical visit. A score of 4 or greater is at risk for sleep apnea.

## 2015-03-09 LAB — URINE CULTURE
Colony Count: NO GROWTH
Culture: NO GROWTH

## 2015-03-19 ENCOUNTER — Encounter (HOSPITAL_COMMUNITY): Admission: RE | Payer: Self-pay | Source: Ambulatory Visit

## 2015-03-19 ENCOUNTER — Inpatient Hospital Stay (HOSPITAL_COMMUNITY): Admission: RE | Admit: 2015-03-19 | Payer: Medicare Other | Source: Ambulatory Visit | Admitting: Orthopedic Surgery

## 2015-03-19 HISTORY — DX: Unilateral primary osteoarthritis, right knee: M17.11

## 2015-03-19 SURGERY — ARTHROPLASTY, KNEE, TOTAL
Anesthesia: General | Laterality: Right

## 2015-06-13 NOTE — H&P (Signed)
TOTAL KNEE ADMISSION H&P  Patient is being admitted for right total knee arthroplasty.  Subjective:  Chief Complaint:right knee pain.  HPI: Faith Scott, 74 y.o. female, has a history of pain and functional disability in the right knee due to arthritis and has failed non-surgical conservative treatments for greater than 12 weeks to includeNSAID's and/or analgesics, corticosteriod injections, viscosupplementation injections, flexibility and strengthening excercises, supervised PT with diminished ADL's post treatment, weight reduction as appropriate and activity modification.  Onset of symptoms was gradual, starting 10 years ago with gradually worsening course since that time. The patient noted prior procedures on the knee to include  arthroscopy and menisectomy on the right knee(s).  Patient currently rates pain in the right knee(s) at 9 out of 10 with activity. Patient has night pain, worsening of pain with activity and weight bearing, pain that interferes with activities of daily living, crepitus and joint swelling.  Patient has evidence of subchondral sclerosis, periarticular osteophytes and joint space narrowing by imaging studies.  There is no active infection.  Patient Active Problem List   Diagnosis Date Noted  . Primary localized osteoarthritis of right knee   . Anxiety   . Hypercholesteremia   . Hypertension    Past Medical History  Diagnosis Date  . Hypertension   . Hypothyroid     resolved as of 03/07/15 no medications anymore  . Hypercholesteremia   . Anxiety   . Primary localized osteoarthritis of right knee   . Mitral valve prolapse   . Sleep apnea     Did have sleep apnea, does not have it since having UVULOPALATOPHARYNGOPLASTY   . Shortness of breath dyspnea     with slight exertion  . Pneumonia     hx of  . Urgency of urination     frequency of urination as well  . Sinusitis   . GERD (gastroesophageal reflux disease)   . Hepatitis     age 33; pt unsure of what  kind  . Chronic kidney disease     Stage 2 kidney disease    Past Surgical History  Procedure Laterality Date  . Shoulder surgery Left   . Knee arthroscopy w/ meniscectomy Right   . Shoulder surgery Right   . Tubal ligation    . Uvulopalatopharyngoplasty      no CPAP needed now  . Colonoscopy    . Toe surgery Bilateral     Had toes straightened and bunions removed    No prescriptions prior to admission   Allergies  Allergen Reactions  . Calan [Verapamil] Other (See Comments)    Hair loss  . Lisinopril Cough    Social History  Substance Use Topics  . Smoking status: Former Games developer  . Smokeless tobacco: Not on file  . Alcohol Use: 0.0 oz/week    0 Standard drinks or equivalent per week     Comment: 2 times a month; socially    Family History  Problem Relation Age of Onset  . Hypertension Mother   . Stroke Mother   . Clotting disorder Sister     sister died at age 14 of a postoperative blood clot  . Clotting disorder Mother     mother had a stoke postopertively     Review of Systems  Constitutional: Negative.   HENT: Negative.   Eyes: Negative.   Respiratory: Negative.   Cardiovascular: Negative.   Gastrointestinal: Negative.   Genitourinary: Negative.   Musculoskeletal: Positive for joint pain.  Skin: Negative.   Neurological:  Negative.   Endo/Heme/Allergies: Negative.   Psychiatric/Behavioral: Negative.     Objective:  Physical Exam  Constitutional: She is oriented to person, place, and time. She appears well-developed and well-nourished.  HENT:  Head: Normocephalic and atraumatic.  Mouth/Throat: Oropharynx is clear and moist.  Eyes: Conjunctivae are normal. Pupils are equal, round, and reactive to light.  Neck: Neck supple.  Cardiovascular: Normal rate and regular rhythm.   Respiratory: Effort normal and breath sounds normal.  GI: Bowel sounds are normal.  Genitourinary:  Not pertinent to current symptomatology therefore not examined.   Musculoskeletal:   right knee has a moderate varus deformity.  Diffuse pain. 1+ crepitus, full range of motion.  She is ligamentously stable with normal patellar tracking.  Left knee has full range of motion, 1+ crepitus, 1+ synovitis and anterior knee pain today.    Neurological: She is alert and oriented to person, place, and time.  Skin: Skin is warm.  Psychiatric: She has a normal mood and affect.    Vital signs in last 24 hours: Temp:  [98.1 F (36.7 C)] 98.1 F (36.7 C) (09/14 1600) Pulse Rate:  [64] 64 (09/14 1600) BP: (143)/(89) 143/89 mmHg (09/14 1600) SpO2:  [95 %] 95 % (09/14 1600) Weight:  [89.359 kg (197 lb)] 89.359 kg (197 lb) (09/14 1600)  Labs:   Estimated body mass index is 33.8 kg/(m^2) as calculated from the following:   Height as of this encounter:  (1.626 m).   Weight as of this encounter: 89.359 kg (197 lb).   Imaging Review Plain radiographs demonstrate severe degenerative joint disease of the right knee(s). The overall alignment issignificant varus. The bone quality appears to be good for age and reported activity level.  Assessment/Plan:  End stage arthritis, right knee Principal Problem:   Primary localized osteoarthritis of right knee Active Problems:   Anxiety   Hypercholesteremia   Hypertension    The patient history, physical examination, clinical judgment of the provider and imaging studies are consistent with end stage degenerative joint disease of the right knee(s) and total knee arthroplasty is deemed medically necessary. The treatment options including medical management, injection therapy arthroscopy and arthroplasty were discussed at length. The risks and benefits of total knee arthroplasty were presented and reviewed. The risks due to aseptic loosening, infection, stiffness, patella tracking problems, thromboembolic complications and other imponderables were discussed. The patient acknowledged the explanation, agreed to proceed with  the plan and consent was signed. Patient is being admitted for inpatient treatment for surgery, pain control, PT, OT, prophylactic antibiotics, VTE prophylaxis, progressive ambulation and ADL's and discharge planning. The patient is planning to be discharged to skilled nursing facility

## 2015-06-15 ENCOUNTER — Encounter (HOSPITAL_COMMUNITY): Payer: Self-pay

## 2015-06-15 ENCOUNTER — Encounter (HOSPITAL_COMMUNITY)
Admission: RE | Admit: 2015-06-15 | Discharge: 2015-06-15 | Disposition: A | Discharge status: Home / Self Care | Payer: Medicare Other | Source: Ambulatory Visit | Attending: Orthopedic Surgery | Admitting: Orthopedic Surgery

## 2015-06-15 DIAGNOSIS — Z01812 Encounter for preprocedural laboratory examination: Secondary | ICD-10-CM | POA: Insufficient documentation

## 2015-06-15 DIAGNOSIS — Z0183 Encounter for blood typing: Secondary | ICD-10-CM | POA: Insufficient documentation

## 2015-06-15 DIAGNOSIS — M179 Osteoarthritis of knee, unspecified: Secondary | ICD-10-CM | POA: Insufficient documentation

## 2015-06-15 HISTORY — DX: Depression, unspecified: F32.A

## 2015-06-15 HISTORY — DX: Major depressive disorder, single episode, unspecified: F32.9

## 2015-06-15 LAB — COMPREHENSIVE METABOLIC PANEL
ALK PHOS: 67 U/L (ref 38–126)
ALT: 26 U/L (ref 14–54)
AST: 33 U/L (ref 15–41)
Albumin: 3.8 g/dL (ref 3.5–5.0)
Anion gap: 9 (ref 5–15)
BILIRUBIN TOTAL: 1 mg/dL (ref 0.3–1.2)
BUN: 14 mg/dL (ref 6–20)
CALCIUM: 9.5 mg/dL (ref 8.9–10.3)
CHLORIDE: 103 mmol/L (ref 101–111)
CO2: 28 mmol/L (ref 22–32)
CREATININE: 0.94 mg/dL (ref 0.44–1.00)
GFR, EST NON AFRICAN AMERICAN: 59 mL/min — AB (ref >60)
Glucose, Bld: 104 mg/dL — ABNORMAL HIGH (ref 65–99)
Potassium: 3.6 mmol/L (ref 3.5–5.1)
Sodium: 140 mmol/L (ref 135–145)
Total Protein: 6.8 g/dL (ref 6.5–8.1)

## 2015-06-15 LAB — PROTIME-INR
INR: 0.99 (ref 0.00–1.49)
PROTHROMBIN TIME: 13.3 s (ref 11.6–15.2)

## 2015-06-15 LAB — CBC WITH DIFFERENTIAL/PLATELET
BASOS ABS: 0 10*3/uL (ref 0.0–0.1)
Basophils Relative: 1 %
EOS PCT: 2 %
Eosinophils Absolute: 0.1 10*3/uL (ref 0.0–0.7)
HEMATOCRIT: 42 % (ref 36.0–46.0)
HEMOGLOBIN: 14.7 g/dL (ref 12.0–15.0)
LYMPHS ABS: 1.7 10*3/uL (ref 0.7–4.0)
LYMPHS PCT: 27 %
MCH: 29.5 pg (ref 26.0–34.0)
MCHC: 35 g/dL (ref 30.0–36.0)
MCV: 84.3 fL (ref 78.0–100.0)
Monocytes Absolute: 0.3 10*3/uL (ref 0.1–1.0)
Monocytes Relative: 6 %
NEUTROS ABS: 4 10*3/uL (ref 1.7–7.7)
Neutrophils Relative %: 64 %
Platelets: 204 10*3/uL (ref 150–400)
RBC: 4.98 MIL/uL (ref 3.87–5.11)
RDW: 13.4 % (ref 11.5–15.5)
WBC: 6.2 10*3/uL (ref 4.0–10.5)

## 2015-06-15 LAB — SURGICAL PCR SCREEN
MRSA, PCR: NEGATIVE
Staphylococcus aureus: NEGATIVE

## 2015-06-15 LAB — TYPE AND SCREEN
ABO/RH(D): A POS
Antibody Screen: NEGATIVE

## 2015-06-15 LAB — APTT: APTT: 28 s (ref 24–37)

## 2015-06-15 NOTE — Progress Notes (Signed)
PCP is Dr Elias Else Denies seeing a cardiologist Stress test noted from 11-2014 EKG noted under media tab 07-07-2014 and 12-11-14 See note from Rica Mast 03-08-15 Pt was scheduled to have surgery in June, but broke her right foot 4 days before her knee surgery, she is still in a boot to the right leg. Denies any other changes in her health since.

## 2015-06-15 NOTE — Pre-Procedure Instructions (Signed)
Faith Scott  06/15/2015      [id number] [place], [place] - [address] [place] [address] Phone: [phone] Fax: [phone]  [facility] - [place], [place] - [phone] AT PORTAL TO REGISTERED [facility] SITES [address] Faith Scott [name] [address] Phone: [phone] Fax: [phone]    Your procedure is scheduled on Sept 26.  Report to [facility] Admitting at 845 A.M.  Call this number if you have problems the morning of surgery:  [phone]   Remember:  Do not eat food or drink liquids after midnight.  Take these medicines the morning of surgery with A SIP OF WATER Flonase spray if needed, pantoprazole (protonix), oxybutynin (Ditropan) Stop taking Asprin, Ibuprofen, Aleve, BC's, Goody's, Herbal medications, Fish Oil   Do not wear jewelry, make-up or nail polish.  Do not wear lotions, powders, or perfumes.  You may wear deodorant.  Do not shave 48 hours prior to surgery.  Men may shave face and neck.  Do not bring valuables to the hospital.  [facility] is not responsible for any belongings or valuables.  Contacts, dentures or bridgework may not be worn into surgery.  Leave your suitcase in the car.  After surgery it may be brought to your room.  For patients admitted to the hospital, discharge time will be determined by your treatment team.  Patients discharged the day of surgery will not be allowed to drive home.    Special instructions:  [facility] - Preparing for Surgery  Before surgery, you can play an important role.  Because skin is not sterile, your skin needs to be as free of germs as possible.  You can reduce the number of germs on you skin by washing with CHG (chlorahexidine gluconate) soap before surgery.  CHG is an antiseptic cleaner which kills germs and bonds with the skin to continue killing germs even after washing.  Please DO NOT use if you have an allergy to CHG or  antibacterial soaps.  If your skin becomes reddened/irritated stop using the CHG and inform your nurse when you arrive at Short Stay.  Do not shave (including legs and underarms) for at least 48 hours prior to the first CHG shower.  You may shave your face.  Please follow these instructions carefully:   1.  Shower with CHG Soap the night before surgery and the   morning of Surgery.  2.  If you choose to wash your hair, wash your hair first as usual with your  normal shampoo.  3.  After you shampoo, rinse your hair and body thoroughly to remove the  Shampoo.  4.  Use CHG as you would any other liquid soap.  You can apply chg directly to the skin and wash gently with scrungie or a clean washcloth.  5.  Apply the CHG Soap to your body ONLY FROM THE NECK DOWN.        Do not use on open wounds or open sores.  Avoid contact with your eyes,ears, mouth and genitals (private parts).  Wash genitals (private parts)  with your normal soap.  6.  Wash thoroughly, paying special attention to the area where your surgery will be performed.  7.  Thoroughly rinse your body with warm water from the neck down.  8.  DO NOT shower/wash with your normal soap after using and rinsing off  the CHG Soap.  9.  Pat yourself dry  with a clean towel.            10.  Wear clean pajamas.            11.  Place clean sheets on your bed the night of your first shower and do not  sleep with pets.  Day of Surgery  Do not apply any lotions/deoderants the morning of surgery.  Please wear clean clothes to the hospital/surgery center.     Please read over the following fact sheets that you were given. Pain Booklet, Coughing and Deep Breathing, Blood Transfusion Information, MRSA Information and Surgical Site Infection Prevention

## 2015-06-16 LAB — URINE CULTURE

## 2015-06-21 ENCOUNTER — Other Ambulatory Visit (HOSPITAL_COMMUNITY): Payer: Medicare Other

## 2015-06-25 ENCOUNTER — Inpatient Hospital Stay (HOSPITAL_COMMUNITY)
Admission: RE | Admit: 2015-06-25 | Discharge: 2015-06-27 | DRG: 470 | Disposition: A | Discharge status: Home Health | Payer: Medicare Other | Source: Ambulatory Visit | Attending: Orthopedic Surgery | Admitting: Orthopedic Surgery

## 2015-06-25 ENCOUNTER — Encounter (HOSPITAL_COMMUNITY)
Admission: RE | Disposition: A | Discharge status: Home Health | Payer: Self-pay | Source: Ambulatory Visit | Attending: Orthopedic Surgery

## 2015-06-25 ENCOUNTER — Inpatient Hospital Stay (HOSPITAL_COMMUNITY): Payer: Medicare Other | Admitting: Anesthesiology

## 2015-06-25 ENCOUNTER — Encounter (HOSPITAL_COMMUNITY): Payer: Self-pay | Admitting: Anesthesiology

## 2015-06-25 ENCOUNTER — Inpatient Hospital Stay (HOSPITAL_COMMUNITY): Payer: Medicare Other

## 2015-06-25 DIAGNOSIS — K219 Gastro-esophageal reflux disease without esophagitis: Secondary | ICD-10-CM | POA: Diagnosis present

## 2015-06-25 DIAGNOSIS — E669 Obesity, unspecified: Secondary | ICD-10-CM | POA: Diagnosis present

## 2015-06-25 DIAGNOSIS — E7800 Pure hypercholesterolemia, unspecified: Secondary | ICD-10-CM | POA: Diagnosis present

## 2015-06-25 DIAGNOSIS — M80051A Age-related osteoporosis with current pathological fracture, right femur, initial encounter for fracture: Secondary | ICD-10-CM | POA: Diagnosis present

## 2015-06-25 DIAGNOSIS — M1711 Unilateral primary osteoarthritis, right knee: Secondary | ICD-10-CM | POA: Diagnosis present

## 2015-06-25 DIAGNOSIS — I129 Hypertensive chronic kidney disease with stage 1 through stage 4 chronic kidney disease, or unspecified chronic kidney disease: Secondary | ICD-10-CM | POA: Diagnosis present

## 2015-06-25 DIAGNOSIS — M179 Osteoarthritis of knee, unspecified: Secondary | ICD-10-CM | POA: Diagnosis present

## 2015-06-25 DIAGNOSIS — Z87891 Personal history of nicotine dependence: Secondary | ICD-10-CM | POA: Diagnosis not present

## 2015-06-25 DIAGNOSIS — M84453A Pathological fracture, unspecified femur, initial encounter for fracture: Secondary | ICD-10-CM | POA: Diagnosis not present

## 2015-06-25 DIAGNOSIS — I341 Nonrheumatic mitral (valve) prolapse: Secondary | ICD-10-CM | POA: Diagnosis present

## 2015-06-25 DIAGNOSIS — E039 Hypothyroidism, unspecified: Secondary | ICD-10-CM | POA: Diagnosis present

## 2015-06-25 DIAGNOSIS — G4730 Sleep apnea, unspecified: Secondary | ICD-10-CM | POA: Diagnosis present

## 2015-06-25 DIAGNOSIS — Z6833 Body mass index (BMI) 33.0-33.9, adult: Secondary | ICD-10-CM

## 2015-06-25 DIAGNOSIS — Z419 Encounter for procedure for purposes other than remedying health state, unspecified: Secondary | ICD-10-CM

## 2015-06-25 DIAGNOSIS — M1710 Unilateral primary osteoarthritis, unspecified knee: Secondary | ICD-10-CM | POA: Diagnosis present

## 2015-06-25 DIAGNOSIS — F419 Anxiety disorder, unspecified: Secondary | ICD-10-CM | POA: Diagnosis present

## 2015-06-25 DIAGNOSIS — Z888 Allergy status to other drugs, medicaments and biological substances status: Secondary | ICD-10-CM

## 2015-06-25 DIAGNOSIS — I10 Essential (primary) hypertension: Secondary | ICD-10-CM | POA: Diagnosis present

## 2015-06-25 DIAGNOSIS — M25561 Pain in right knee: Secondary | ICD-10-CM | POA: Diagnosis present

## 2015-06-25 DIAGNOSIS — N182 Chronic kidney disease, stage 2 (mild): Secondary | ICD-10-CM | POA: Diagnosis present

## 2015-06-25 HISTORY — PX: TOTAL KNEE ARTHROPLASTY: SHX125

## 2015-06-25 HISTORY — DX: Pathological fracture, unspecified femur, initial encounter for fracture: M84.453A

## 2015-06-25 LAB — CBC
HEMATOCRIT: 41.2 % (ref 36.0–46.0)
HEMOGLOBIN: 14.2 g/dL (ref 12.0–15.0)
MCH: 29.6 pg (ref 26.0–34.0)
MCHC: 34.5 g/dL (ref 30.0–36.0)
MCV: 86 fL (ref 78.0–100.0)
PLATELETS: 204 10*3/uL (ref 150–400)
RBC: 4.79 MIL/uL (ref 3.87–5.11)
RDW: 13.7 % (ref 11.5–15.5)
WBC: 8 10*3/uL (ref 4.0–10.5)

## 2015-06-25 LAB — CREATININE, SERUM
Creatinine, Ser: 0.81 mg/dL (ref 0.44–1.00)
GFR calc non Af Amer: >60 mL/min (ref >60)

## 2015-06-25 SURGERY — ARTHROPLASTY, KNEE, TOTAL
Anesthesia: Regional | Site: Knee | Laterality: Right

## 2015-06-25 MED ORDER — BUPIVACAINE-EPINEPHRINE (PF) 0.5% -1:200000 IJ SOLN
INTRAMUSCULAR | Status: DC | PRN
  Administered 2015-06-25: 30 mL via PERINEURAL

## 2015-06-25 MED ORDER — ALUM & MAG HYDROXIDE-SIMETH 200-200-20 MG/5ML PO SUSP: 30.0000 mL | ORAL | Status: DC | PRN

## 2015-06-25 MED ORDER — MENTHOL 3 MG MT LOZG: 1.0000 | LOZENGE | OROMUCOSAL | Status: DC | PRN

## 2015-06-25 MED ORDER — MIDAZOLAM HCL 2 MG/2ML IJ SOLN
INTRAMUSCULAR | Status: AC
  Filled 2015-06-25: qty 4

## 2015-06-25 MED ORDER — ONDANSETRON HCL 4 MG/2ML IJ SOLN
INTRAMUSCULAR | Status: AC
  Filled 2015-06-25: qty 2

## 2015-06-25 MED ORDER — OXYCODONE HCL 5 MG PO TABS
5.0000 mg | ORAL_TABLET | ORAL | Status: DC | PRN
  Administered 2015-06-25 – 2015-06-27 (×12): 10 mg via ORAL
  Filled 2015-06-25 (×13): qty 2

## 2015-06-25 MED ORDER — METOCLOPRAMIDE HCL 5 MG PO TABS
5.0000 mg | ORAL_TABLET | Freq: Three times a day (TID) | ORAL | Status: DC | PRN
Start: 2015-06-25 — End: 2015-06-27

## 2015-06-25 MED ORDER — DIPHENHYDRAMINE HCL 12.5 MG/5ML PO ELIX
12.5000 mg | ORAL_SOLUTION | ORAL | Status: DC | PRN
  Administered 2015-06-25: 25 mg via ORAL
  Administered 2015-06-26: 12.5 mg via ORAL
  Administered 2015-06-26: 25 mg via ORAL
  Filled 2015-06-25 (×3): qty 10

## 2015-06-25 MED ORDER — PROPOFOL 500 MG/50ML IV EMUL
INTRAVENOUS | Status: DC | PRN
Start: 2015-06-25 — End: 2015-06-25
  Administered 2015-06-25: 25 ug/kg/min via INTRAVENOUS

## 2015-06-25 MED ORDER — MIDAZOLAM HCL 2 MG/2ML IJ SOLN: 2.0000 mg | Freq: Once | INTRAMUSCULAR | Status: DC

## 2015-06-25 MED ORDER — FENTANYL CITRATE (PF) 100 MCG/2ML IJ SOLN: 50.0000 ug | Freq: Once | INTRAMUSCULAR | Status: DC

## 2015-06-25 MED ORDER — BUPIVACAINE IN DEXTROSE 0.75-8.25 % IT SOLN
INTRATHECAL | Status: DC | PRN
  Administered 2015-06-25: 1 mL via INTRATHECAL

## 2015-06-25 MED ORDER — ENOXAPARIN SODIUM 30 MG/0.3ML ~~LOC~~ SOLN
30.0000 mg | Freq: Two times a day (BID) | SUBCUTANEOUS | Status: DC
  Administered 2015-06-26 – 2015-06-27 (×3): 30 mg via SUBCUTANEOUS
  Filled 2015-06-25 (×3): qty 0.3

## 2015-06-25 MED ORDER — CEFUROXIME SODIUM 1.5 G IJ SOLR
INTRAMUSCULAR | Status: AC
  Filled 2015-06-25: qty 1.5

## 2015-06-25 MED ORDER — LIDOCAINE HCL (CARDIAC) 20 MG/ML IV SOLN
INTRAVENOUS | Status: AC
  Filled 2015-06-25: qty 5

## 2015-06-25 MED ORDER — CHLORHEXIDINE GLUCONATE 4 % EX LIQD: 60.0000 mL | Freq: Once | CUTANEOUS | Status: DC

## 2015-06-25 MED ORDER — LACTATED RINGERS IV SOLN: INTRAVENOUS | Status: DC

## 2015-06-25 MED ORDER — FENTANYL CITRATE (PF) 100 MCG/2ML IJ SOLN
100.0000 ug | Freq: Once | INTRAMUSCULAR | Status: AC
  Administered 2015-06-25 (×2): 50 ug via INTRAVENOUS

## 2015-06-25 MED ORDER — HYDROMORPHONE HCL 1 MG/ML IJ SOLN: 0.5000 mg | INTRAMUSCULAR | Status: DC | PRN

## 2015-06-25 MED ORDER — FENTANYL CITRATE (PF) 250 MCG/5ML IJ SOLN
INTRAMUSCULAR | Status: AC
  Filled 2015-06-25: qty 5

## 2015-06-25 MED ORDER — BUPIVACAINE HCL (PF) 0.5 % IJ SOLN
INTRAMUSCULAR | Status: DC | PRN
  Administered 2015-06-25: 2 mL via INTRATHECAL

## 2015-06-25 MED ORDER — DEXAMETHASONE SODIUM PHOSPHATE 10 MG/ML IJ SOLN
INTRAMUSCULAR | Status: AC
  Filled 2015-06-25: qty 1

## 2015-06-25 MED ORDER — POLYETHYLENE GLYCOL 3350 17 G PO PACK
17.0000 g | PACK | Freq: Two times a day (BID) | ORAL | Status: DC
  Administered 2015-06-25 – 2015-06-27 (×4): 17 g via ORAL
  Filled 2015-06-25 (×4): qty 1

## 2015-06-25 MED ORDER — FAMOTIDINE 20 MG PO TABS
10.0000 mg | ORAL_TABLET | Freq: Every day | ORAL | Status: DC
  Filled 2015-06-25: qty 1

## 2015-06-25 MED ORDER — ONDANSETRON HCL 4 MG/2ML IJ SOLN
INTRAMUSCULAR | Status: DC | PRN
  Administered 2015-06-25: 4 mg via INTRAVENOUS

## 2015-06-25 MED ORDER — LOSARTAN POTASSIUM-HCTZ 100-25 MG PO TABS: 1.0000 | ORAL_TABLET | Freq: Every day | ORAL | Status: DC

## 2015-06-25 MED ORDER — MIDAZOLAM HCL 5 MG/5ML IJ SOLN
INTRAMUSCULAR | Status: DC | PRN
  Administered 2015-06-25: 2 mg via INTRAVENOUS

## 2015-06-25 MED ORDER — CEFUROXIME SODIUM 1.5 G IJ SOLR
INTRAMUSCULAR | Status: DC | PRN
  Administered 2015-06-25: 1.5 g

## 2015-06-25 MED ORDER — PANTOPRAZOLE SODIUM 40 MG PO TBEC: 40.0000 mg | DELAYED_RELEASE_TABLET | Freq: Every day | ORAL | Status: DC

## 2015-06-25 MED ORDER — CELECOXIB 200 MG PO CAPS
200.0000 mg | ORAL_CAPSULE | Freq: Two times a day (BID) | ORAL | Status: DC
  Administered 2015-06-25 – 2015-06-26 (×2): 200 mg via ORAL
  Filled 2015-06-25 (×4): qty 1

## 2015-06-25 MED ORDER — SODIUM CHLORIDE 0.9 % IR SOLN
Status: DC | PRN
  Administered 2015-06-25: 1000 mL
  Administered 2015-06-25: 3000 mL

## 2015-06-25 MED ORDER — FENTANYL CITRATE (PF) 100 MCG/2ML IJ SOLN
INTRAMUSCULAR | Status: AC
Start: 2015-06-25 — End: 2015-06-25
  Filled 2015-06-25: qty 2

## 2015-06-25 MED ORDER — DOXAZOSIN MESYLATE 4 MG PO TABS
4.0000 mg | ORAL_TABLET | Freq: Every day | ORAL | Status: DC
  Administered 2015-06-25 – 2015-06-26 (×2): 4 mg via ORAL
  Filled 2015-06-25 (×3): qty 1

## 2015-06-25 MED ORDER — MIDAZOLAM HCL 2 MG/2ML IJ SOLN
INTRAMUSCULAR | Status: AC
  Administered 2015-06-25: 2 mg
  Filled 2015-06-25: qty 2

## 2015-06-25 MED ORDER — POVIDONE-IODINE 7.5 % EX SOLN
Freq: Once | CUTANEOUS | Status: DC
  Filled 2015-06-25: qty 118

## 2015-06-25 MED ORDER — ONDANSETRON HCL 4 MG/2ML IJ SOLN: 4.0000 mg | Freq: Four times a day (QID) | INTRAMUSCULAR | Status: DC | PRN

## 2015-06-25 MED ORDER — PHENOL 1.4 % MT LIQD: 1.0000 | OROMUCOSAL | Status: DC | PRN

## 2015-06-25 MED ORDER — LIDOCAINE HCL (CARDIAC) 20 MG/ML IV SOLN
INTRAVENOUS | Status: DC | PRN
  Administered 2015-06-25: 40 mg via INTRAVENOUS

## 2015-06-25 MED ORDER — BUPIVACAINE-EPINEPHRINE (PF) 0.25% -1:200000 IJ SOLN
INTRAMUSCULAR | Status: AC
  Filled 2015-06-25: qty 30

## 2015-06-25 MED ORDER — PROMETHAZINE HCL 25 MG/ML IJ SOLN: 6.2500 mg | INTRAMUSCULAR | Status: DC | PRN

## 2015-06-25 MED ORDER — ACETAMINOPHEN 325 MG PO TABS: 650.0000 mg | ORAL_TABLET | Freq: Four times a day (QID) | ORAL | Status: DC | PRN

## 2015-06-25 MED ORDER — INFLUENZA VAC SPLIT QUAD 0.5 ML IM SUSY
0.5000 mL | PREFILLED_SYRINGE | INTRAMUSCULAR | Status: AC
  Administered 2015-06-27: 0.5 mL via INTRAMUSCULAR
  Filled 2015-06-25: qty 0.5

## 2015-06-25 MED ORDER — ONDANSETRON HCL 4 MG PO TABS: 4.0000 mg | ORAL_TABLET | Freq: Four times a day (QID) | ORAL | Status: DC | PRN

## 2015-06-25 MED ORDER — DEXTROSE 5 % IV SOLN
INTRAVENOUS | Status: DC | PRN
  Administered 2015-06-25: 11:00:00 via INTRAVENOUS

## 2015-06-25 MED ORDER — BUPIVACAINE HCL (PF) 0.5 % IJ SOLN
INTRAMUSCULAR | Status: AC
  Filled 2015-06-25: qty 10

## 2015-06-25 MED ORDER — DEXAMETHASONE SODIUM PHOSPHATE 10 MG/ML IJ SOLN
INTRAMUSCULAR | Status: DC | PRN
  Administered 2015-06-25: 10 mg via INTRAVENOUS

## 2015-06-25 MED ORDER — MEPERIDINE HCL 25 MG/ML IJ SOLN: 6.2500 mg | INTRAMUSCULAR | Status: DC | PRN

## 2015-06-25 MED ORDER — FLUTICASONE PROPIONATE 50 MCG/ACT NA SUSP
2.0000 | Freq: Every day | NASAL | Status: DC | PRN
  Filled 2015-06-25: qty 16

## 2015-06-25 MED ORDER — MIDAZOLAM HCL 2 MG/2ML IJ SOLN: 0.5000 mg | Freq: Once | INTRAMUSCULAR | Status: DC | PRN

## 2015-06-25 MED ORDER — METOCLOPRAMIDE HCL 5 MG/ML IJ SOLN
5.0000 mg | Freq: Three times a day (TID) | INTRAMUSCULAR | Status: DC | PRN
Start: 2015-06-25 — End: 2015-06-27

## 2015-06-25 MED ORDER — CEFAZOLIN SODIUM-DEXTROSE 2-3 GM-% IV SOLR
2.0000 g | INTRAVENOUS | Status: AC
  Administered 2015-06-25: 2 g via INTRAVENOUS

## 2015-06-25 MED ORDER — LOSARTAN POTASSIUM 50 MG PO TABS
100.0000 mg | ORAL_TABLET | Freq: Every day | ORAL | Status: DC
  Administered 2015-06-26 – 2015-06-27 (×2): 100 mg via ORAL
  Filled 2015-06-25 (×2): qty 2

## 2015-06-25 MED ORDER — POTASSIUM CHLORIDE IN NACL 20-0.9 MEQ/L-% IV SOLN
INTRAVENOUS | Status: DC
  Administered 2015-06-25 – 2015-06-26 (×2): via INTRAVENOUS
  Filled 2015-06-25 (×2): qty 1000

## 2015-06-25 MED ORDER — HYDROCHLOROTHIAZIDE 25 MG PO TABS
25.0000 mg | ORAL_TABLET | Freq: Every day | ORAL | Status: DC
  Administered 2015-06-26 – 2015-06-27 (×2): 25 mg via ORAL
  Filled 2015-06-25 (×2): qty 1

## 2015-06-25 MED ORDER — PRAVASTATIN SODIUM 40 MG PO TABS
40.0000 mg | ORAL_TABLET | Freq: Every day | ORAL | Status: DC
  Administered 2015-06-26: 40 mg via ORAL
  Filled 2015-06-25: qty 1

## 2015-06-25 MED ORDER — PROPOFOL 500 MG/50ML IV EMUL: INTRAVENOUS | Status: DC | PRN

## 2015-06-25 MED ORDER — ACETAMINOPHEN 650 MG RE SUPP: 650.0000 mg | Freq: Four times a day (QID) | RECTAL | Status: DC | PRN

## 2015-06-25 MED ORDER — DOCUSATE SODIUM 100 MG PO CAPS
100.0000 mg | ORAL_CAPSULE | Freq: Two times a day (BID) | ORAL | Status: DC
  Administered 2015-06-25 – 2015-06-27 (×4): 100 mg via ORAL
  Filled 2015-06-25 (×3): qty 1

## 2015-06-25 MED ORDER — HYDROMORPHONE HCL 1 MG/ML IJ SOLN
INTRAMUSCULAR | Status: AC
  Filled 2015-06-25: qty 1

## 2015-06-25 MED ORDER — LACTATED RINGERS IV SOLN
INTRAVENOUS | Status: DC
  Administered 2015-06-25 (×2): via INTRAVENOUS

## 2015-06-25 MED ORDER — DEXAMETHASONE SODIUM PHOSPHATE 10 MG/ML IJ SOLN
10.0000 mg | Freq: Three times a day (TID) | INTRAMUSCULAR | Status: DC
  Administered 2015-06-25 – 2015-06-27 (×5): 10 mg via INTRAVENOUS
  Filled 2015-06-25 (×6): qty 1

## 2015-06-25 MED ORDER — HYDROMORPHONE HCL 1 MG/ML IJ SOLN
0.2500 mg | INTRAMUSCULAR | Status: DC | PRN
  Administered 2015-06-25 (×2): 0.5 mg via INTRAVENOUS

## 2015-06-25 MED ORDER — CEFAZOLIN SODIUM-DEXTROSE 2-3 GM-% IV SOLR
2.0000 g | Freq: Four times a day (QID) | INTRAVENOUS | Status: AC
  Administered 2015-06-25 – 2015-06-26 (×2): 2 g via INTRAVENOUS
  Filled 2015-06-25 (×3): qty 50

## 2015-06-25 MED ORDER — OXYBUTYNIN CHLORIDE 5 MG PO TABS
5.0000 mg | ORAL_TABLET | Freq: Every day | ORAL | Status: DC
  Administered 2015-06-25 – 2015-06-27 (×3): 5 mg via ORAL
  Filled 2015-06-25 (×3): qty 1

## 2015-06-25 MED ORDER — BUPIVACAINE-EPINEPHRINE 0.25% -1:200000 IJ SOLN
INTRAMUSCULAR | Status: DC | PRN
  Administered 2015-06-25: 30 mL

## 2015-06-25 SURGICAL SUPPLY — 86 items
BANDAGE ELASTIC 6 VELCRO ST LF (GAUZE/BANDAGES/DRESSINGS) ×3 IMPLANT
BANDAGE ESMARK 6X9 LF (GAUZE/BANDAGES/DRESSINGS) ×1 IMPLANT
BENZOIN TINCTURE PRP APPL 2/3 (GAUZE/BANDAGES/DRESSINGS) ×3 IMPLANT
BIT DRILL CANN 3.2MM (BIT) ×1 IMPLANT
BLADE SAGITTAL 25.0X1.19X90 (BLADE) ×2 IMPLANT
BLADE SAGITTAL 25.0X1.19X90MM (BLADE) ×1
BLADE SAW SGTL 13.0X1.19X90.0M (BLADE) ×3 IMPLANT
BLADE SURG 10 STRL SS (BLADE) ×6 IMPLANT
BNDG ELASTIC 6X15 VLCR STRL LF (GAUZE/BANDAGES/DRESSINGS) ×3 IMPLANT
BNDG ESMARK 6X9 LF (GAUZE/BANDAGES/DRESSINGS) ×3
BOWL SMART MIX CTS (DISPOSABLE) ×3 IMPLANT
CAPT KNEE TOTAL 3 ATTUNE ×3 IMPLANT
CEMENT HV SMART SET (Cement) ×6 IMPLANT
CLOSURE STERI-STRIP 1/2X4 (GAUZE/BANDAGES/DRESSINGS) ×1
CLOSURE WOUND 1/2 X4 (GAUZE/BANDAGES/DRESSINGS) ×1
CLSR STERI-STRIP ANTIMIC 1/2X4 (GAUZE/BANDAGES/DRESSINGS) ×2 IMPLANT
COVER SURGICAL LIGHT HANDLE (MISCELLANEOUS) ×3 IMPLANT
CUFF TOURNIQUET SINGLE 34IN LL (TOURNIQUET CUFF) ×3 IMPLANT
DECANTER SPIKE VIAL GLASS SM (MISCELLANEOUS) ×3 IMPLANT
DRAPE C-ARM 42X72 X-RAY (DRAPES) ×3 IMPLANT
DRAPE EXTREMITY T 121X128X90 (DRAPE) ×3 IMPLANT
DRAPE INCISE IOBAN 66X45 STRL (DRAPES) ×3 IMPLANT
DRAPE PROXIMA HALF (DRAPES) ×6 IMPLANT
DRAPE U-SHAPE 47X51 STRL (DRAPES) ×3 IMPLANT
DRILL BIT CANN 3.2MM (BIT) ×2
DRSG AQUACEL AG ADV 3.5X10 (GAUZE/BANDAGES/DRESSINGS) ×3 IMPLANT
DRSG AQUACEL AG ADV 3.5X14 (GAUZE/BANDAGES/DRESSINGS) ×3 IMPLANT
DRSG PAD ABDOMINAL 8X10 ST (GAUZE/BANDAGES/DRESSINGS) ×6 IMPLANT
DURAPREP 26ML APPLICATOR (WOUND CARE) ×6 IMPLANT
ELECT CAUTERY BLADE 6.4 (BLADE) ×3 IMPLANT
ELECT REM PT RETURN 9FT ADLT (ELECTROSURGICAL) ×3
ELECTRODE REM PT RTRN 9FT ADLT (ELECTROSURGICAL) ×1 IMPLANT
EVACUATOR 1/8 PVC DRAIN (DRAIN) ×3 IMPLANT
FACESHIELD WRAPAROUND (MASK) ×3 IMPLANT
GAUZE SPONGE 4X4 12PLY STRL (GAUZE/BANDAGES/DRESSINGS) ×3 IMPLANT
GLOVE BIO SURGEON STRL SZ 6.5 (GLOVE) ×4 IMPLANT
GLOVE BIO SURGEON STRL SZ7 (GLOVE) ×15 IMPLANT
GLOVE BIO SURGEONS STRL SZ 6.5 (GLOVE) ×2
GLOVE BIOGEL PI IND STRL 7.0 (GLOVE) ×1 IMPLANT
GLOVE BIOGEL PI IND STRL 7.5 (GLOVE) ×1 IMPLANT
GLOVE BIOGEL PI INDICATOR 7.0 (GLOVE) ×2
GLOVE BIOGEL PI INDICATOR 7.5 (GLOVE) ×2
GLOVE ECLIPSE 7.0 STRL STRAW (GLOVE) ×3 IMPLANT
GLOVE SS BIOGEL STRL SZ 7.5 (GLOVE) ×1 IMPLANT
GLOVE SUPERSENSE BIOGEL SZ 7.5 (GLOVE) ×2
GLOVE SURG SS PI 6.5 STRL IVOR (GLOVE) ×3 IMPLANT
GOWN STRL REUS W/ TWL LRG LVL3 (GOWN DISPOSABLE) ×1 IMPLANT
GOWN STRL REUS W/ TWL XL LVL3 (GOWN DISPOSABLE) ×2 IMPLANT
GOWN STRL REUS W/TWL LRG LVL3 (GOWN DISPOSABLE) ×2
GOWN STRL REUS W/TWL XL LVL3 (GOWN DISPOSABLE) ×4
GUIDEWIRE THREADED 1.6 (WIRE) ×6 IMPLANT
HANDPIECE INTERPULSE COAX TIP (DISPOSABLE) ×2
HOOD PEEL AWAY FACE SHEILD DIS (HOOD) ×9 IMPLANT
IMMOBILIZER KNEE 22 UNIV (SOFTGOODS) ×3 IMPLANT
KIT BASIN OR (CUSTOM PROCEDURE TRAY) ×3 IMPLANT
KIT ROOM TURNOVER OR (KITS) ×3 IMPLANT
MANIFOLD NEPTUNE II (INSTRUMENTS) ×3 IMPLANT
MARKER SKIN DUAL TIP RULER LAB (MISCELLANEOUS) ×3 IMPLANT
NS IRRIG 1000ML POUR BTL (IV SOLUTION) ×3 IMPLANT
PACK TOTAL JOINT (CUSTOM PROCEDURE TRAY) ×3 IMPLANT
PACK UNIVERSAL I (CUSTOM PROCEDURE TRAY) ×3 IMPLANT
PAD ARMBOARD 7.5X6 YLW CONV (MISCELLANEOUS) ×6 IMPLANT
PADDING CAST COTTON 6X4 STRL (CAST SUPPLIES) ×3 IMPLANT
RUBBERBAND STERILE (MISCELLANEOUS) ×3 IMPLANT
SCREW CANN 64MM (Screw) ×3 IMPLANT
SCREW CANN 68MM (Screw) ×3 IMPLANT
SCREW CANN 72MM (Screw) ×3 IMPLANT
SET HNDPC FAN SPRY TIP SCT (DISPOSABLE) ×1 IMPLANT
SPONGE GAUZE 4X4 12PLY STER LF (GAUZE/BANDAGES/DRESSINGS) ×3 IMPLANT
STRIP CLOSURE SKIN 1/2X4 (GAUZE/BANDAGES/DRESSINGS) ×2 IMPLANT
SUCTION FRAZIER TIP 10 FR DISP (SUCTIONS) ×3 IMPLANT
SUT MNCRL AB 3-0 PS2 18 (SUTURE) ×3 IMPLANT
SUT VIC AB 0 CT1 27 (SUTURE) ×4
SUT VIC AB 0 CT1 27XBRD ANBCTR (SUTURE) ×2 IMPLANT
SUT VIC AB 1 CT1 27 (SUTURE)
SUT VIC AB 1 CT1 27XBRD ANBCTR (SUTURE) IMPLANT
SUT VIC AB 2-0 CT1 27 (SUTURE) ×6
SUT VIC AB 2-0 CT1 TAPERPNT 27 (SUTURE) ×3 IMPLANT
SYR 30ML SLIP (SYRINGE) ×3 IMPLANT
TOWEL OR 17X24 6PK STRL BLUE (TOWEL DISPOSABLE) ×3 IMPLANT
TOWEL OR 17X26 10 PK STRL BLUE (TOWEL DISPOSABLE) ×3 IMPLANT
TRAY FOLEY CATH 16FR SILVER (SET/KITS/TRAYS/PACK) ×3 IMPLANT
TUBE CONNECTING 12'X1/4 (SUCTIONS) ×1
TUBE CONNECTING 12X1/4 (SUCTIONS) ×2 IMPLANT
WASHER FOR 4.5 SCREWS (Washer) ×6 IMPLANT
YANKAUER SUCT BULB TIP NO VENT (SUCTIONS) ×3 IMPLANT

## 2015-06-25 NOTE — Interval H&P Note (Signed)
History and Physical Interval Note:  06/25/2015 11:00 AM  Faith Scott  has presented today for surgery, with the diagnosis of primary localized OA right knee  The various methods of treatment have been discussed with the patient and family. After consideration of risks, benefits and other options for treatment, the patient has consented to  Procedure(s): TOTAL KNEE ARTHROPLASTY (Right) as a surgical intervention .  The patient's history has been reviewed, patient examined, no change in status, stable for surgery.  I have reviewed the patient's chart and labs.  Questions were answered to the patient's satisfaction.     Salvatore Marvel A

## 2015-06-25 NOTE — Evaluation (Signed)
Physical Therapy Evaluation Patient Details Name: Faith Scott MRN: [id number] DOB: 1941/09/12 Today's Date: 06/25/2015   History of Present Illness  Patient is a 74 y/o female s/p Rt TKA. PMH includes HTN, anxiety, MVP, Hepatitis, CKD, recent broken foot.  Clinical Impression  Patient presents with pain and post surgical deficits RLE s/p R TKA. Mobility assessment limited due to bleeding from RLE once in dependent position. Returned to supine and RN reinforced bleeding site. Lengthy discussion with pt and spouse re: expectations for mobility, TDWB status, exercises, positioning, zero degree knee etc. Tolerated exercises. Pt plans to discharge to [facility] to maximize independence and mobility prior to return home. Will follow acutely.     Follow Up Recommendations SNF    Equipment Recommendations  None recommended by PT    Recommendations for Other Services OT consult     Precautions / Restrictions Precautions Precautions: Fall;Knee Precaution Booklet Issued: No Precaution Comments: Reviewed knee precautions and no pillow under knee. Restrictions Weight Bearing Restrictions: Yes RLE Weight Bearing: Touchdown weight bearing      Mobility  Bed Mobility Overal bed mobility: Needs Assistance Bed Mobility: Supine to Sit;Sit to Supine     Supine to sit: Supervision;HOB elevated Sit to supine: Supervision;HOB elevated   General bed mobility comments: Supervision for safety. Upon getting to EOB, pt bleeding from RLE when in dependent position. RN notified. Reinforced with ABD pads and ace. Returned to supine.  Transfers Overall transfer level:  (Deferred secondary to bleeding.)                  Ambulation/Gait                Stairs            Wheelchair Mobility    Modified Rankin (Stroke Patients Only)       Balance Overall balance assessment: Needs assistance Sitting-balance support: Feet supported;No upper extremity supported Sitting  balance-Leahy Scale: Good                                       Pertinent Vitals/Pain Pain Assessment: Faces Faces Pain Scale: Hurts even more Pain Location: right knee Pain Descriptors / Indicators: Sore Pain Intervention(s): Monitored during session;Repositioned;RN gave pain meds during session    Home Living Family/patient expects to be discharged to:: Skilled nursing facility Living Arrangements: Spouse/significant other Available Help at Discharge: Family;Available 24 hours/day Type of Home: House Home Access: Stairs to enter Entrance Stairs-Rails: Right Entrance Stairs-Number of Steps: 2-3 Home Layout: Two level Home Equipment: Walker - 2 wheels      Prior Function Level of Independence: Independent               Hand Dominance        Extremity/Trunk Assessment   Upper Extremity Assessment: Defer to OT evaluation           Lower Extremity Assessment: RLE deficits/detail RLE Deficits / Details: Able to perform SLR with knee extension lag. Limited AROM/strength 2/2 to recent surgery/pain.       Communication   Communication: No difficulties  Cognition Arousal/Alertness: Awake/alert Behavior During Therapy: WFL for tasks assessed/performed Overall Cognitive Status: Within Functional Limits for tasks assessed                      General Comments General comments (skin integrity, edema, etc.): Spouse present in room during session.  Exercises Total Joint Exercises Ankle Circles/Pumps: Both;15 reps;Supine Quad Sets: Both;15 reps;Supine Gluteal Sets: Both;10 reps;Supine      Assessment/Plan    PT Assessment Patient needs continued PT services  PT Diagnosis Acute pain;Difficulty walking   PT Problem List Decreased strength;Pain;Decreased range of motion;Decreased mobility  PT Treatment Interventions Balance training;Gait training;Functional mobility training;Therapeutic activities;Therapeutic exercise;Patient/family  education;Stair training   PT Goals (Current goals can be found in the Care Plan section) Acute Rehab PT Goals Patient Stated Goal: to go to rehab prior to going home. PT Goal Formulation: With patient Time For Goal Achievement: 07/09/15 Potential to Achieve Goals: Fair    Frequency 7X/week   Barriers to discharge Inaccessible home environment      Co-evaluation               End of Session Equipment Utilized During Treatment: Gait belt Activity Tolerance: Patient tolerated treatment well;Other (comment) (limited secondary to bleeding from RLE.) Patient left: in bed;with call bell/phone within reach;with family/visitor present;Other (comment) (with zero degree knee.)           Time: 4540-9811 PT Time Calculation (min) (ACUTE ONLY): 30 min   Charges:   PT Evaluation $Initial PT Evaluation Tier I: 1 Procedure PT Treatments $Therapeutic Exercise: 8-22 mins   PT G Codes:        Shauna A Hartshorne 06/25/2015, 4:57 PM Mylo Red, PT, DPT [phone]

## 2015-06-25 NOTE — Progress Notes (Signed)
Orthopedic Tech Progress Note Patient Details:  Faith Scott 1941-01-30 [id number] On cpm at 7:00 pm Patient ID: Faith Scott, female   DOB: Jan 04, 1941, 74 y.o.   MRN: [id number]   Jennye Moccasin 06/25/2015, 6:55 PM

## 2015-06-25 NOTE — Progress Notes (Signed)
Orthopedic Tech Progress Note Patient Details:  Faith Scott 1941-04-05 [id number]  CPM Right Knee CPM Right Knee: On Right Knee Flexion (Degrees): 60 Right Knee Extension (Degrees): 0 Additional Comments: trapeze bar patient helper Order changed to 0-60 degrees for right knee according to RN MaRK  [name] 06/25/2015, 2:43 PM

## 2015-06-25 NOTE — Transfer of Care (Signed)
Immediate Anesthesia Transfer of Care Note  Patient: Faith Scott  Procedure(s) Performed: Procedure(s): TOTAL KNEE ARTHROPLASTY , OPEN REDUCTION INTERNAL FIXATION RIGHT MEDIAL FEMORL CONDYLE (Right)  Patient Location: PACU  Anesthesia Type:Spinal  Level of Consciousness: awake, alert , oriented and patient cooperative  Airway & Oxygen Therapy: Room Air, Spontaneous Breathing   Post-op Assessment: Report given to RN and Post -op Vital signs reviewed and stable  Post vital signs: Reviewed and stable  Last Vitals:  Filed Vitals:   06/25/15 0855  BP:   Pulse: 63  Temp: 36.4 C  Resp: 20    Complications: No apparent anesthesia complications

## 2015-06-25 NOTE — Progress Notes (Signed)
Utilization review completed.  

## 2015-06-25 NOTE — Anesthesia Procedure Notes (Addendum)
Anesthesia Regional Block:  Adductor canal block  Pre-Anesthetic Checklist: ,, timeout performed, Correct Patient, Correct Site, Correct Laterality, Correct Procedure, Correct Position, site marked, Risks and benefits discussed, Surgical consent,  Pre-op evaluation,  Post-op pain management  Laterality: Right  Prep: chloraprep       Needles:  Injection technique: Single-shot  Needle Type: Stimiplex     Needle Length: 9cm 9 cm Needle Gauge: 21 and 21 G    Additional Needles:  Procedures: ultrasound guided (picture in chart) Adductor canal block Narrative:  Injection made incrementally with aspirations every 5 mL.  Performed by: Personally  Anesthesiologist: Lewie Loron  Additional Notes: BP cuff, EKG monitors applied. Sedation begun. Artery and nerve location verified with U/S and anesthetic injected incrementally, slowly, and after negative aspirations under direct u/s guidance. Good fascial /perineural spread. Tolerated well.   Spinal Patient location during procedure: OR Staffing Anesthesiologist: Lewie Loron Performed by: anesthesiologist  Preanesthetic Checklist Completed: patient identified, site marked, surgical consent, pre-op evaluation, timeout performed, IV checked, risks and benefits discussed and monitors and equipment checked Spinal Block Patient position: sitting Prep: ChloraPrep Patient monitoring: heart rate, cardiac monitor, continuous pulse ox and blood pressure Approach: midline Location: L3-4 Needle Needle type: Sprotte  Needle gauge: 24 G Needle length: 9 cm

## 2015-06-25 NOTE — Anesthesia Preprocedure Evaluation (Addendum)
Anesthesia Evaluation  Patient identified by MRN, date of birth, ID band Patient awake    Reviewed: Allergy & Precautions, NPO status , Patient's Chart, lab work & pertinent test results  Airway Mallampati: II  TM Distance: >3 FB Neck ROM: Full    Dental no notable dental hx. (+) Teeth Intact, Dental Advisory Given   Pulmonary shortness of breath, sleep apnea , pneumonia, resolved, former smoker,  Pt claims that OSA has improved since UPP but husband says she still has issues   Pulmonary exam normal breath sounds clear to auscultation       Cardiovascular hypertension, Pt. on medications Normal cardiovascular exam+ Valvular Problems/Murmurs MVP  Rhythm:Regular Rate:Normal     Neuro/Psych PSYCHIATRIC DISORDERS Anxiety Depression negative neurological ROS     GI/Hepatic GERD  Medicated and Controlled,(+) Hepatitis -  Endo/Other  Hypothyroidism   Renal/GU Renal disease     Musculoskeletal  (+) Arthritis ,   Abdominal (+) + obese,   Peds  Hematology negative hematology ROS (+)   Anesthesia Other Findings   Reproductive/Obstetrics negative OB ROS                           Anesthesia Physical Anesthesia Plan  ASA: III  Anesthesia Plan: Regional and Spinal   Post-op Pain Management:    Induction:   Airway Management Planned:   Additional Equipment:   Intra-op Plan:   Post-operative Plan:   Informed Consent: I have reviewed the patients History and Physical, chart, labs and discussed the procedure including the risks, benefits and alternatives for the proposed anesthesia with the patient or authorized representative who has indicated his/her understanding and acceptance.   Dental advisory given  Plan Discussed with: CRNA  Anesthesia Plan Comments:         Anesthesia Quick Evaluation

## 2015-06-25 NOTE — Progress Notes (Signed)
Off monitor pending transfer to floor

## 2015-06-26 ENCOUNTER — Encounter (HOSPITAL_COMMUNITY): Payer: Self-pay | Admitting: Physician Assistant

## 2015-06-26 DIAGNOSIS — M84453A Pathological fracture, unspecified femur, initial encounter for fracture: Secondary | ICD-10-CM | POA: Diagnosis not present

## 2015-06-26 HISTORY — DX: Pathological fracture, unspecified femur, initial encounter for fracture: M84.453A

## 2015-06-26 LAB — BASIC METABOLIC PANEL
ANION GAP: 7 (ref 5–15)
BUN: 15 mg/dL (ref 6–20)
CALCIUM: 8.3 mg/dL — AB (ref 8.9–10.3)
CO2: 27 mmol/L (ref 22–32)
Chloride: 102 mmol/L (ref 101–111)
Creatinine, Ser: 0.95 mg/dL (ref 0.44–1.00)
GFR calc Af Amer: >60 mL/min (ref >60)
GFR, EST NON AFRICAN AMERICAN: 58 mL/min — AB (ref >60)
GLUCOSE: 152 mg/dL — AB (ref 65–99)
Potassium: 4.5 mmol/L (ref 3.5–5.1)
SODIUM: 136 mmol/L (ref 135–145)

## 2015-06-26 LAB — CBC
HCT: 37 % (ref 36.0–46.0)
Hemoglobin: 12.9 g/dL (ref 12.0–15.0)
MCH: 29.9 pg (ref 26.0–34.0)
MCHC: 34.9 g/dL (ref 30.0–36.0)
MCV: 85.8 fL (ref 78.0–100.0)
PLATELETS: 199 10*3/uL (ref 150–400)
RBC: 4.31 MIL/uL (ref 3.87–5.11)
RDW: 13.7 % (ref 11.5–15.5)
WBC: 11.5 10*3/uL — AB (ref 4.0–10.5)

## 2015-06-26 NOTE — Anesthesia Postprocedure Evaluation (Signed)
Anesthesia Post Note  Patient: Faith Scott  Procedure(s) Performed: Procedure(s) (LRB): TOTAL KNEE ARTHROPLASTY , OPEN REDUCTION INTERNAL FIXATION RIGHT MEDIAL FEMORL CONDYLE (Right)  Anesthesia type: Spinal  Patient location: PACU  Post pain: Pain level controlled  Post assessment: Post-op Vital signs reviewed  Last Vitals: BP 103/62 mmHg  Pulse 53  Temp(Src) 36.5 C (Oral)  Resp 18  Ht  (1.6 m)  Wt 197 lb (89.359 kg)  BMI 34.91 kg/m2  SpO2 94%  Post vital signs: Reviewed  Level of consciousness: sedated  Complications: No apparent anesthesia complications

## 2015-06-26 NOTE — Progress Notes (Signed)
Physical Therapy Treatment Patient Details Name: Faith Scott MRN: [id number] DOB: 09/19/1941 Today's Date: 06/26/2015    History of Present Illness Patient is a 74 y/o female s/p Rt TKA. PMH includes HTN, anxiety, MVP, Hepatitis, CKD, recent broken foot.    PT Comments    Patient progressing slowly towards PT goals. Improved ambulation distance today but requires cues for RW management and TDWB during mobility. + dizziness during gait. Provided handout and reviewed exercises. Discharge plan updated to home with 24/7 S/assist as pt can stay on main level and only needs to negotiate 2 steps to get into home. Will plan for gait training and bed mobility next session as tolerated. Will follow acutely per current POC.    Follow Up Recommendations  Home health PT;Supervision/Assistance - 24 hour     Equipment Recommendations  Rolling walker with 5" wheels    Recommendations for Other Services       Precautions / Restrictions Precautions Precautions: Fall;Knee Precaution Booklet Issued: Yes (comment) Precaution Comments: Reviewed knee precautions and no pillow under knee. Restrictions Weight Bearing Restrictions: Yes RLE Weight Bearing: Touchdown weight bearing Other Position/Activity Restrictions: Spoke with PA, Baxter Hire- states she wants pt's foot flat on floor during gait but still TDWB.    Mobility  Bed Mobility               General bed mobility comments: Sitting in recliner upon PT arrival.   Transfers Overall transfer level: Needs assistance Equipment used: Rolling walker (2 wheeled) Transfers: Sit to/from Stand Sit to Stand: Supervision         General transfer comment: Supervision for safety. Compliant with TDWB upon standing.   Ambulation/Gait Ambulation/Gait assistance: Min guard Ambulation Distance (Feet): 50 Feet Assistive device: Rolling walker (2 wheeled) Gait Pattern/deviations: Decreased stance time - right;Decreased step length - left;Trunk  flexed;Step-to pattern   Gait velocity interpretation: <1.8 ft/sec, indicative of risk for recurrent falls General Gait Details: Cues to maintain TDWB during gait as pt placing weight through RLE during stance phase. Cues for gait mechanics and RW proximity/management.    Stairs            Wheelchair Mobility    Modified Rankin (Stroke Patients Only)       Balance Overall balance assessment: Needs assistance Sitting-balance support: Feet supported;No upper extremity supported Sitting balance-Leahy Scale: Good     Standing balance support: During functional activity Standing balance-Leahy Scale: Poor Standing balance comment: Relient on RW for support.                     Cognition Arousal/Alertness: Awake/alert Behavior During Therapy: WFL for tasks assessed/performed Overall Cognitive Status: Within Functional Limits for tasks assessed                      Exercises Total Joint Exercises Quad Sets: Both;10 reps;Seated Towel Squeeze: Both;10 reps;Seated Heel Slides: Right;10 reps;Seated Hip ABduction/ADduction: Right;10 reps;Seated Long Arc Quad: Right;10 reps;Seated Goniometric ROM: 2-90 degrees knee AROM    General Comments        Pertinent Vitals/Pain Pain Assessment: Faces Faces Pain Scale: Hurts little more Pain Location: right knee Pain Descriptors / Indicators: Sore;Aching Pain Intervention(s): Monitored during session;Repositioned;Premedicated before session    Home Living                      Prior Function            PT Goals (current goals can now  be found in the care plan section) Progress towards PT goals: Progressing toward goals    Frequency  7X/week    PT Plan Discharge plan needs to be updated    Co-evaluation             End of Session Equipment Utilized During Treatment: Gait belt Activity Tolerance: Patient tolerated treatment well Patient left: in chair;with call bell/phone within  reach;Other (comment) (in zero degree knee.)     Time: 1000-1033 PT Time Calculation (min) (ACUTE ONLY): 33 min  Charges:  $Gait Training: 8-22 mins $Therapeutic Exercise: 8-22 mins                    G Codes:      Shauna A Hartshorne 06/26/2015, 11:51 AM Mylo Red, PT, DPT [phone]

## 2015-06-26 NOTE — Evaluation (Signed)
Occupational Therapy Evaluation Patient Details Name: Faith Scott MRN: [id number] DOB: 12/21/1940 Today's Date: 06/26/2015    History of Present Illness Patient is a 74 y/o female s/p Rt TKA. PMH includes HTN, anxiety, MVP, Hepatitis, CKD, recent broken foot.   Clinical Impression   Pt reports she was independent in ADLs PTA. Currently pt is min guard overall for ADLs. Pt planning on d/c home with 24 hour supervision from her husband. Pt educated and practiced touch down weight bearing status, pt verbalizes understanding but has difficulty adhering to WB status with functional mobility. Pt reports that there is a bedroom and bathroom on the entry level that she can use until she is able to manage the stairs in her home. Pt would benefit from skilled acute OT services in order to increase independence with functional transfers while maintaining WB status.     Follow Up Recommendations  No OT follow up;Supervision - Intermittent    Equipment Recommendations  None recommended by OT    Recommendations for Other Services       Precautions / Restrictions Precautions Precautions: Fall;Knee Precaution Booklet Issued: Yes (comment) Precaution Comments: Reviewed knee precautions and no pillow under knee. Restrictions Weight Bearing Restrictions: Yes RLE Weight Bearing: Touchdown weight bearing Other Position/Activity Restrictions: Spoke with PA, Baxter Hire- states she wants pt's foot flat on floor during gait but still TDWB.      Mobility Bed Mobility Overal bed mobility: Needs Assistance Bed Mobility: Supine to Sit;Sit to Supine     Supine to sit: Supervision;HOB elevated Sit to supine: Supervision;HOB elevated   General bed mobility comments: Sitting in recliner upon PT arrival.   Transfers Overall transfer level: Needs assistance Equipment used: Rolling walker (2 wheeled) Transfers: Sit to/from Stand Sit to Stand: Min guard         General transfer comment: Verbal cues  for TDWB    Balance Overall balance assessment: Needs assistance Sitting-balance support: Feet supported Sitting balance-Leahy Scale: Good     Standing balance support: Bilateral upper extremity supported Standing balance-Leahy Scale: Poor Standing balance comment: RW for support                            ADL Overall ADL's : Needs assistance/impaired Eating/Feeding: Set up;Sitting   Grooming: Standing;Min guard   Upper Body Bathing: Supervision/ safety;Sitting   Lower Body Bathing: Min guard;Sit to/from stand   Upper Body Dressing : Supervision/safety;Sitting   Lower Body Dressing: Min guard;Sit to/from stand Lower Body Dressing Details (indicate cue type and reason): Pt able to don/doff B socks Toilet Transfer: Min guard;Ambulation;BSC (BSC over toilet)   Toileting- Clothing Manipulation and Hygiene: Min guard;Sit to/from stand   Tub/ Shower Transfer: Min guard;Walk-in shower;Shower seat;Ambulation;Rolling walker   Functional mobility during ADLs: Min guard General ADL Comments: Educated pt on compensatory strategies for LB ADLs, walk in shower tranfer technique, TWB status. Practiced TWB, pt with difficulty adhering to WB status     Vision     Perception     Praxis      Pertinent Vitals/Pain Pain Assessment: 0-10 Pain Score: 4  Faces Pain Scale: Hurts little more Pain Location: R knee Pain Descriptors / Indicators: Aching;Sore Pain Intervention(s): Limited activity within patient's tolerance;Monitored during session;Repositioned;RN gave pain meds during session;Ice applied     Hand Dominance     Extremity/Trunk Assessment Upper Extremity Assessment Upper Extremity Assessment: Overall WFL for tasks assessed   Lower Extremity Assessment Lower Extremity Assessment: Defer  to PT evaluation   Cervical / Trunk Assessment Cervical / Trunk Assessment: Normal   Communication Communication Communication: No difficulties   Cognition  Arousal/Alertness: Awake/alert Behavior During Therapy: WFL for tasks assessed/performed Overall Cognitive Status: Within Functional Limits for tasks assessed                     General Comments       Exercises       Shoulder Instructions      Home Living Family/patient expects to be discharged to:: Private residence   Available Help at Discharge: Family;Available 24 hours/day Type of Home: House Home Access: Stairs to enter [employer] of Steps: 2-3 Entrance Stairs-Rails: Right Home Layout: Two level;Able to live on main level with bedroom/bathroom     Bathroom Shower/Tub: Producer, television/film/video: Handicapped height Bathroom Accessibility: Yes How Accessible: Accessible via walker Home Equipment: Walker - 2 wheels;Shower seat - built in;Grab bars - tub/shower;Bedside commode          Prior Functioning/Environment Level of Independence: Independent             OT Diagnosis: Generalized weakness;Acute pain   OT Problem List: Decreased strength;Decreased activity tolerance;Impaired balance (sitting and/or standing);Decreased safety awareness;Decreased knowledge of use of DME or AE;Decreased knowledge of precautions;Pain   OT Treatment/Interventions: Self-care/ADL training;DME and/or AE instruction;Patient/family education    OT Goals(Current goals can be found in the care plan section) Acute Rehab OT Goals Patient Stated Goal: to go home OT Goal Formulation: With patient Time For Goal Achievement: 07/10/15 Potential to Achieve Goals: Good ADL Goals Pt Will Perform Grooming: with modified independence;standing Pt Will Perform Lower Body Bathing: with modified independence;sit to/from stand Pt Will Perform Lower Body Dressing: with modified independence;sit to/from stand Pt Will Transfer to Toilet: with modified independence;ambulating;bedside commode (BSC over toilet) Pt Will Perform Tub/Shower Transfer: Shower transfer;with modified  independence;ambulating;shower seat;rolling walker;grab bars  OT Frequency: Min 2X/week   Barriers to D/C:            Co-evaluation              End of Session Equipment Utilized During Treatment: Gait belt;Rolling walker CPM Right Knee CPM Right Knee: Off  Activity Tolerance: Patient tolerated treatment well Patient left: in bed;with call bell/phone within reach   Time: 1150-1215 OT Time Calculation (min): 25 min Charges:  OT General Charges $OT Visit: 1 Procedure OT Evaluation $Initial OT Evaluation Tier I: 1 Procedure OT Treatments $Self Care/Home Management : 8-22 mins G-Codes:     Gaye Alken M.S., OTR/L Pager: [phone]  06/26/2015, 1:28 PM

## 2015-06-26 NOTE — Op Note (Signed)
NAME:  Faith Scott, Faith Scott               ACCOUNT NO.:  [id number]  MEDICAL RECORD NO.:  [id number]  LOCATION:  [id number]                        FACILITY:  [facility]  PHYSICIAN:  Elana Alm. Thurston Hole, M.D. DATE OF BIRTH:  1941-09-13  DATE OF PROCEDURE:  06/25/2015 DATE OF DISCHARGE:                              OPERATIVE REPORT   PREOPERATIVE DIAGNOSIS:  Right knee primary localized osteoarthritis.  POSTOPERATIVE DIAGNOSES: 1. Right knee primary localized osteoarthritis. 2. Right knee medial femoral condyle fracture.  PROCEDURE: 1. Right knee EUA, followed by a total knee replacement, using DePuy     ATTUNE cemented total knee prosthesis with #5 cemented femur, #5     cemented tibia, with 7 mm polyethylene RP tibial spacer and 29 mm     polyethylene cemented patella. 2. Open reduction and internal fixation of right knee medial femoral     condyle, fracture.  SURGEON:  Elana Alm. Thurston Hole, M.D.  ASSISTANT:  Kirstin Shepperson, PA-C.  ANESTHESIA:  Spinal.  OPERATIVE TIME:  2 hours.  COMPLICATIONS:  None.  DESCRIPTION OF PROCEDURE:  Ms. Riden was brought to the operating room on June 25, 2015, after a femoral nerve block was placed in the holding room by Anesthesia.  She was placed on operative table in supine position.  She had a spinal anesthetic administered by Anesthesia without complications.  She had a Foley catheter placed under sterile conditions.  She received antibiotics preoperatively for prophylaxis. Her right knee was examined under anesthesia.  Range of motion was from 0-125 degrees, moderate valgus deformity.  Knee stable to ligamentous exam with normal patellar tracking.  The right leg was prepped using sterile DuraPrep and draped using sterile technique.  Time-out procedure was called and the correct right knee identified.  The right leg was exsanguinated and a thigh tourniquet elevated to 365 mm.  Initially, through a 7 cm longitudinal incision based over the patella,  initial exposure was made.  The underlying subcutaneous tissues were incised along with the skin incision.  A median arthrotomy was performed revealing an excessive amount of normal-appearing joint fluid.  The articular surfaces were inspected.  She had grade IV changes laterally, grade III changes medially, and grade III and IV changes in the patellofemoral joint.  Osteophytes removed from the femoral condyles and tibial plateau.  Medial and lateral meniscal remnants were removed as well as the anterior and posterior cruciate ligament.  At this point, intramedullary drill was drilled up the femoral canal for placement of the distal femoral cutting jig, which was placed in the appropriate manner rotation and a distal 9 mm cut was made.  The proximal tibia was then exposed.  The proximal tibial cutting jig, which was then placed in the appropriate manner rotation and a proximal tibial cut was made, using a stylus off the lower lateral side resecting 2 mm off the lateral side and 6 mm off the medial side.  This gave excellent balancing to the knee from her back from her valgus deformity.  After this was done, then the distal femur was sized.  #5 was found to be the appropriate size and #5 cutting jig was placed in the appropriate amount of external rotation and then  anterior-posterior and chamfer cuts were made, without complication.  At this point then the proximal tibia was exposed.  A #5 tibial base plate trial was placed with excellent sizing and then the keel cut was made.  The distal femoral box cutter was then placed and then this cut was made.  After this was done and with a #5 femoral trial in place and a #5 tibial base plate, and a 7 mm polyethylene RP tibial spacer, there was found to be excellent stability, excellent restoration of range of motion from 0-120 degrees, with excellent correction of her valgus deformity.  At this point, all the trial components were removed. At this  point, it was noted that the medial femoral condyle, had a fracture line that had propagated up the medial femoral condyle, and out the medial femoral cortex in an unstable manner.  Her bones were significantly osteoporotic and it is felt that this fracture came from the osteopenia in addition to the standard cuts that were made on the distal femur.  Because of this, then 2 separate 4.0 mm cannulated screws were placed from medial to lateral across the fracture line into the lateral femoral cortex under fluoroscopic control, giving excellent anatomic reduction and rigid internal fixation.  At this point, then the proximal tibia was exposed and the tibial base plate with cement backing was hammered in position with an excellent fit, with excess cement being removed from around the edges.  The #5 femoral component with cement backing was very gently hammered into place with no displacement noted of the fracture and again excellent fixation with the cannulated screws. Excess cement being removed from around the edges.  The 7 mm polyethylene RP tibial spacer was then placed and the knee reduced, taken through a full range of motion and found to be stable with excellent correction of her valgus deformity.  The 29 mm polyethylene patella with cement backing was then placed in position and held there with a clamp.  After the cement hardened, again patellofemoral tracking was evaluated and found to be normal and the medial femoral condyle fracture was found to be stable.  Intraoperative fluoroscopy confirmed excellent position of the screws and of the total knee replacement and anatomic reduction of the fracture.  At this point, the wound was again further irrigated with 3 L of saline solution.  Tourniquet was released. Hemostasis was obtained with cautery and then the arthrotomy was closed with #1 Vicryl running suture and 0 Vicryl running suture to close the arthrotomy and the sub vastus  exposure as well over 2 medium Hemovac drains.  Subcutaneous tissues closed with 2-0 Vicryl subcuticular layer closed with 4-0 Monocryl.  Sterile dressings were applied and a long-leg splint and the patient awakened and taken to recovery room in stable condition.  Needle, sponge counts correct x2 at the end the case.     Robert A. Thurston Hole, M.D.     RAW/MEDQ  D:  06/25/2015  T:  06/26/2015  Job:  [id number]

## 2015-06-26 NOTE — Plan of Care (Signed)
Problem: Consults Goal: Diagnosis- Total Joint Replacement Primary Total Knee     

## 2015-06-26 NOTE — Progress Notes (Signed)
Physical Therapy Treatment Patient Details Name: Faith Scott MRN: [id number] DOB: 03-14-1941 Today's Date: 06/26/2015    History of Present Illness Patient is a 74 y/o female s/p Rt TKA. PMH includes HTN, anxiety, MVP, Hepatitis, CKD, recent broken foot.    PT Comments    Patient progressing well towards PT goals. Continues to require cues for RW management/proximity and TDWB status during ambulation. 2 LOB posteriorly during gait requiring Min A to prevent fall. Will plan for stair training next session as tolerated with spouse present. Will follow acutely to maximize independence and mobility prior to return home.   Follow Up Recommendations  Home health PT;Supervision/Assistance - 24 hour     Equipment Recommendations  Rolling walker with 5" wheels    Recommendations for Other Services       Precautions / Restrictions Precautions Precautions: Fall;Knee Precaution Booklet Issued: Yes (comment) Precaution Comments: Reviewed knee precautions and no pillow under knee. Restrictions Weight Bearing Restrictions: Yes RLE Weight Bearing: Touchdown weight bearing Other Position/Activity Restrictions: Spoke with PA, Baxter Hire- states she wants pt's foot flat on floor during gait but still TDWB ("a little bit of weight.")    Mobility  Bed Mobility Overal bed mobility: Needs Assistance Bed Mobility: Supine to Sit     Supine to sit: Modified independent (Device/Increase time);HOB elevated       Transfers Overall transfer level: Needs assistance Equipment used: Rolling walker (2 wheeled) Transfers: Sit to/from Stand Sit to Stand: Min guard         General transfer comment: Verbal cues for TDWB. Stood from Landscape architect. Transferred to chair post ambulation bout.  Ambulation/Gait Ambulation/Gait assistance: Min assist Ambulation Distance (Feet): 75 Feet (x2 bouts) Assistive device: Rolling walker (2 wheeled) Gait Pattern/deviations: Decreased stance time - right;Decreased  step length - left;Trunk flexed;Step-to pattern;Antalgic   Gait velocity interpretation: <1.8 ft/sec, indicative of risk for recurrent falls very decreased General Gait Details: Cues to maintain TDWB during gait. Multiple standing rest breaks due to fatigue. 2 LOB posteriorly due to poor positioning in RW requiring Min A to maintain balance.1 seated rest break.   Stairs            Wheelchair Mobility    Modified Rankin (Stroke Patients Only)       Balance Overall balance assessment: Needs assistance Sitting-balance support: Feet supported;No upper extremity supported Sitting balance-Leahy Scale: Good     Standing balance support: During functional activity Standing balance-Leahy Scale: Poor Standing balance comment: RW for support                    Cognition Arousal/Alertness: Awake/alert Behavior During Therapy: WFL for tasks assessed/performed Overall Cognitive Status: Within Functional Limits for tasks assessed                      Exercises Total Joint Exercises Quad Sets: Both;10 reps;Seated Gluteal Sets: Both;10 reps;Seated Hip ABduction/ADduction: Right;10 reps;Seated    General Comments General comments (skin integrity, edema, etc.): No family present during session      Pertinent Vitals/Pain Pain Assessment: Faces Pain Score: 4  Faces Pain Scale: Hurts a little bit Pain Location: right knee Pain Descriptors / Indicators: Sore Pain Intervention(s): Monitored during session;Repositioned;Ice applied    Home Living Family/patient expects to be discharged to:: Private residence   Available Help at Discharge: Family;Available 24 hours/day Type of Home: House Home Access: Stairs to enter Entrance Stairs-Rails: Right Home Layout: Two level;Able to live on main level with bedroom/bathroom Home  Equipment: Dan Humphreys - 2 wheels;Shower seat - built in;Grab bars - tub/shower;Bedside commode      Prior Function Level of Independence:  Independent          PT Goals (current goals can now be found in the care plan section) Acute Rehab PT Goals Patient Stated Goal: to go home Progress towards PT goals: Progressing toward goals    Frequency  7X/week    PT Plan Current plan remains appropriate    Co-evaluation             End of Session Equipment Utilized During Treatment: Gait belt Activity Tolerance: Patient tolerated treatment well Patient left: in chair;with call bell/phone within reach;Other (comment) (inz ero degree knee.)     Time: 1308-6578 PT Time Calculation (min) (ACUTE ONLY): 26 min  Charges:  $Gait Training: 23-37 mins                    G Codes:      Shauna A Hartshorne 06/26/2015, 3:51 PM Mylo Red, PT, DPT [phone]

## 2015-06-26 NOTE — Care Management Note (Signed)
Case Management Note  Patient Details  Name: Faith Scott MRN: [id number] Date of Birth: 1940-11-14  Subjective/Objective:           S/p right total knee arthroplasty         Action/Plan: Set up with [facility] for HHPT by MD office. Spoke with patient, no change in discharge plan. [employer] delivered 3N1 to patient's room, to deliver rolling walker to room and will deliver CPM to home. Patient stated that her husband will be able to assist her after discharge.   Expected Discharge Date:                  Expected Discharge Plan:  Home w Home Health Services  In-House Referral:  NA  Discharge planning Services  CM Consult  Post Acute Care Choice:  Durable Medical Equipment, Home Health Choice offered to:  Patient  DME Arranged:  3-N-1, CPM, Walker rolling DME Agency:  [employer]  HH Arranged:  PT HH Agency:  [employer]  Status of Service:  Completed, signed off  Medicare Important Message Given:    Date Medicare IM Given:    Medicare IM give by:    Date Additional Medicare IM Given:    Additional Medicare Important Message give by:     If discussed at Long Length of Stay Meetings, dates discussed:    Additional Comments:  Monica Becton, RN 06/26/2015, 3:04 PM

## 2015-06-26 NOTE — Progress Notes (Signed)
Subjective: 1 Day Post-Op Procedure(s) (LRB): TOTAL KNEE ARTHROPLASTY , OPEN REDUCTION INTERNAL FIXATION RIGHT MEDIAL FEMORL CONDYLE (Right) Patient reports pain as 5 on 0-10 scale.    Objective: Vital signs in last 24 hours: Temp:  [97.5 F (36.4 C)-98 F (36.7 C)] 97.7 F (36.5 C) (09/27 0514) Pulse Rate:  [49-72] 53 (09/27 0514) Resp:  [11-18] 18 (09/27 0514) BP: (103-161)/(62-96) 103/62 mmHg (09/27 0514) SpO2:  [91 %-96 %] 94 % (09/27 0514)  Intake/Output from previous day: 09/26 0701 - 09/27 0700 In: 1050 [I.V.:1050] Out: 1800 [Urine:1625; Drains:125; Blood:50] Intake/Output this shift:     Recent Labs  06/25/15 1717 06/26/15 0507  HGB 14.2 12.9    Recent Labs  06/25/15 1717 06/26/15 0507  WBC 8.0 11.5*  RBC 4.79 4.31  HCT 41.2 37.0  PLT 204 199    Recent Labs  06/25/15 1717 06/26/15 0507  NA  --  136  K  --  4.5  CL  --  102  CO2  --  27  BUN  --  15  CREATININE 0.81 0.95  GLUCOSE  --  152*  CALCIUM  --  8.3*   No results for input(s): LABPT, INR in the last 72 hours.  ABD soft Neurovascular intact Sensation intact distally Intact pulses distally Dorsiflexion/Plantar flexion intact Incision: moderate drainage  Assessment/Plan: 1 Day Post-Op Procedure(s) (LRB): TOTAL KNEE ARTHROPLASTY , OPEN REDUCTION INTERNAL FIXATION RIGHT MEDIAL FEMORL CONDYLE (Right)  Principal Problem:   Primary localized osteoarthritis of right knee Active Problems:   Anxiety   Hypercholesteremia   Hypertension   DJD (degenerative joint disease) of knee   Insufficiency fracture of medial femoral condyle  Advance diet Up with therapy  Touchdown weight bearing     Home tomorrow if progresses well  SHEPPERSON,KIRSTIN J 06/26/2015, 9:02 AM

## 2015-06-27 DIAGNOSIS — M1711 Unilateral primary osteoarthritis, right knee: Secondary | ICD-10-CM | POA: Diagnosis not present

## 2015-06-27 LAB — CBC
HCT: 34.4 % — ABNORMAL LOW (ref 36.0–46.0)
Hemoglobin: 11.6 g/dL — ABNORMAL LOW (ref 12.0–15.0)
MCH: 29.2 pg (ref 26.0–34.0)
MCHC: 33.7 g/dL (ref 30.0–36.0)
MCV: 86.6 fL (ref 78.0–100.0)
PLATELETS: 196 10*3/uL (ref 150–400)
RBC: 3.97 MIL/uL (ref 3.87–5.11)
RDW: 14 % (ref 11.5–15.5)
WBC: 12.3 10*3/uL — ABNORMAL HIGH (ref 4.0–10.5)

## 2015-06-27 LAB — BASIC METABOLIC PANEL
Anion gap: 6 (ref 5–15)
BUN: 17 mg/dL (ref 6–20)
CO2: 26 mmol/L (ref 22–32)
CREATININE: 0.83 mg/dL (ref 0.44–1.00)
Calcium: 8.5 mg/dL — ABNORMAL LOW (ref 8.9–10.3)
Chloride: 105 mmol/L (ref 101–111)
GFR calc Af Amer: >60 mL/min (ref >60)
GLUCOSE: 132 mg/dL — AB (ref 65–99)
Potassium: 3.7 mmol/L (ref 3.5–5.1)
SODIUM: 137 mmol/L (ref 135–145)

## 2015-06-27 MED ORDER — ENOXAPARIN SODIUM 30 MG/0.3ML ~~LOC~~ SOLN: 30.0000 mg | Freq: Two times a day (BID) | SUBCUTANEOUS | Status: DC

## 2015-06-27 MED ORDER — DOCUSATE SODIUM 100 MG PO CAPS: ORAL_CAPSULE | ORAL | Status: DC

## 2015-06-27 MED ORDER — POLYETHYLENE GLYCOL 3350 17 G PO PACK: PACK | ORAL | Status: DC

## 2015-06-27 MED ORDER — OXYCODONE HCL 5 MG PO TABS: ORAL_TABLET | ORAL | Status: DC

## 2015-06-27 NOTE — Progress Notes (Signed)
Occupational Therapy Treatment Patient Details Name: Faith Scott MRN: [id number] DOB: 1941/02/08 Today's Date: 06/27/2015    History of present illness Patient is a 74 y/o female s/p Rt TKA. PMH includes HTN, anxiety, MVP, Hepatitis, CKD, recent broken foot.   OT comments  Pt progressing toward OT goals. Completed UB dressing with set up, LB dressing with supervision for sit <> stand, verbal cues for compensatory strategies for LB. Educated, demonstrated, and practiced walk in shower transfer with pt. Pt with difficulty adhering to TDWB status during shower transfer, discussed using 3 in 1 in shower and practiced; pt demonstrated. Will continue to follow acutely.    Follow Up Recommendations  No OT follow up;Supervision - Intermittent    Equipment Recommendations  None recommended by OT    Recommendations for Other Services      Precautions / Restrictions Precautions Precautions: Fall;Knee Precaution Booklet Issued: Yes (comment) Precaution Comments: Reviewed knee precautions and no pillow under knee. Restrictions Weight Bearing Restrictions: Yes RLE Weight Bearing: Touchdown weight bearing Other Position/Activity Restrictions: Spoke with PA, Baxter Hire- states she wants pt's foot flat on floor during gait but still TDWB ("a little bit of weight.")       Mobility Bed Mobility               General bed mobility comments: Pt in bathroom bathing upon arrival, returned to recliner at end of session  Transfers Overall transfer level: Needs assistance Equipment used: Rolling walker (2 wheeled) Transfers: Sit to/from Stand Sit to Stand: Supervision         General transfer comment: Stood from [facility] x 3. Verbal cues for maintaining TDWB    Balance Overall balance assessment: Needs assistance Sitting-balance support: Feet supported Sitting balance-Leahy Scale: Good     Standing balance support: During functional activity Standing balance-Leahy Scale: Fair Standing  balance comment: Able to pull up underware and pants without UE support                   ADL Overall ADL's : Needs assistance/impaired                 Upper Body Dressing : Set up;Sitting   Lower Body Dressing: Supervision/safety;Sitting/lateral leans Lower Body Dressing Details (indicate cue type and reason): Supervision for sit <> stand         Tub/ Shower Transfer: Walk-in shower;Min guard;Cueing for safety;Cueing for sequencing;Ambulation;3 in 1;Rolling walker Tub/Shower Transfer Details (indicate cue type and reason): Practiced walk in shower transfer with pt. Educated and demonstrated technique to pt and husband.  Functional mobility during ADLs: Supervision/safety;Rolling walker (verbal cues for adhering to WB status) General ADL Comments: Pt standing in bathroom taking a sponge bath at the sink upon arrival. Educated pt on having a chair close by in the bathroom to sit on if pt gets fatigued, pt agrees this would be a good idea. Educated pt on compensatory strategy for LB dressing, pt demonstrated understanding.       Vision                     Perception     Praxis      Cognition   Behavior During Therapy: [facility] for tasks assessed/performed Overall Cognitive Status: Within Functional Limits for tasks assessed                       Extremity/Trunk Assessment  Exercises    Shoulder Instructions       General Comments      Pertinent Vitals/ Pain       Pain Assessment: Faces Faces Pain Scale: Hurts little more Pain Location: R knee Pain Descriptors / Indicators: Sore;Aching Pain Intervention(s): Limited activity within patient's tolerance;Monitored during session;Repositioned;Ice applied  Home Living                                          Prior Functioning/Environment              Frequency Min 2X/week     Progress Toward Goals  OT Goals(current goals can now be found in the care  plan section)  Progress towards OT goals: Progressing toward goals  Acute Rehab OT Goals Patient Stated Goal: none stated  Plan Discharge plan remains appropriate    Co-evaluation                 End of Session Equipment Utilized During Treatment: Gait belt;Rolling walker CPM Right Knee CPM Right Knee: Off   Activity Tolerance Patient tolerated treatment well   Patient Left in chair;with call bell/phone within reach;with family/visitor present   Nurse Communication          Time: 1610-9604 OT Time Calculation (min): 26 min  Charges: OT General Charges $OT Visit: 1 Procedure OT Treatments $Self Care/Home Management : 23-37 mins  Gaye Alken M.S., OTR/L Pager: [phone]  06/27/2015, 12:43 PM

## 2015-06-27 NOTE — Progress Notes (Signed)
Pt ready for d/c per MD. Cleared by PT/OT, equipment has been delivered to room and set up with hh PT. Discharge teaching and prescriptions were given to pt and husband at bedside, all questions answered. Belongings removed by husband.   Lowella Dell  06/27/2015 2:13 PM

## 2015-06-27 NOTE — Discharge Summary (Signed)
Patient ID: Faith Scott MRN: [id number] DOB/AGE: 1941-03-12 74 y.o.  Admit date: 06/25/2015 Discharge date: 06/27/2015  Admission Diagnoses:  Principal Problem:   Primary localized osteoarthritis of right knee Active Problems:   Anxiety   Hypercholesteremia   Hypertension   DJD (degenerative joint disease) of knee   Insufficiency fracture of medial femoral condyle   Discharge Diagnoses:  Same  Past Medical History  Diagnosis Date  . Hypertension   . Hypothyroid     resolved as of 03/07/15 no medications anymore  . Hypercholesteremia   . Anxiety   . Primary localized osteoarthritis of right knee   . Mitral valve prolapse   . Sleep apnea     Did have sleep apnea, does not have it since having UVULOPALATOPHARYNGOPLASTY   . Shortness of breath dyspnea     with slight exertion  . Pneumonia     hx of  . Urgency of urination     frequency of urination as well  . Sinusitis   . GERD (gastroesophageal reflux disease)   . Hepatitis     age 89; pt unsure of what kind  . Chronic kidney disease     Stage 2 kidney disease  . Depression   . Insufficiency fracture of medial femoral condyle 06/26/2015    Surgeries: Procedure(s): TOTAL KNEE ARTHROPLASTY , OPEN REDUCTION INTERNAL FIXATION RIGHT MEDIAL FEMORL CONDYLE on 06/25/2015   Consultants:    Discharged Condition: Improved  Hospital Course: Faith Scott is an 74 y.o. female who was admitted 06/25/2015 for operative treatment ofPrimary localized osteoarthritis of right knee. Patient has severe unremitting pain that affects sleep, daily activities, and work/hobbies. After pre-op clearance the patient was taken to the operating room on 06/25/2015 and underwent  Procedure(s): TOTAL KNEE ARTHROPLASTY , OPEN REDUCTION INTERNAL FIXATION RIGHT MEDIAL FEMORL CONDYLE.    Patient was given perioperative antibiotics: Anti-infectives    Start     Dose/Rate Route Frequency Ordered Stop   06/25/15 1800  ceFAZolin (ANCEF) IVPB 2 g/50 mL  premix     2 g 100 mL/hr over 30 Minutes Intravenous Every 6 hours 06/25/15 1542 06/26/15 0253   06/25/15 1312  cefUROXime (ZINACEF) injection  Status:  Discontinued       As needed 06/25/15 1312 06/25/15 1404   06/25/15 0938  ceFAZolin (ANCEF) IVPB 2 g/50 mL premix     2 g 100 mL/hr over 30 Minutes Intravenous On call to O.R. 06/25/15 4098 06/25/15 1125       Patient was given sequential compression devices, early ambulation, and chemoprophylaxis to prevent DVT.  Patient benefited maximally from hospital stay and there were no complications.    Recent vital signs: Patient Vitals for the past 24 hrs:  BP Temp Pulse Resp SpO2  06/27/15 0549 140/85 mmHg 98.6 F (37 C) 61 16 95 %  06/26/15 1946 126/70 mmHg 97.7 F (36.5 C) 84 16 98 %  06/26/15 1400 123/63 mmHg 98.1 F (36.7 C) 62 16 90 %     Recent laboratory studies:  Recent Labs  06/26/15 0507 06/27/15 0632  WBC 11.5* 12.3*  HGB 12.9 11.6*  HCT 37.0 34.4*  PLT 199 196  NA 136 137  K 4.5 3.7  CL 102 105  CO2 27 26  BUN 15 17  CREATININE 0.95 0.83  GLUCOSE 152* 132*  CALCIUM 8.3* 8.5*     Discharge Medications:     Medication List    STOP taking these medications  BIEST/PROGESTERONE Crea     Biotin 800 MCG Tabs     CO Q 10 PO     DHEA PO      TAKE these medications        5-HYDROXYTRYPTOPHAN PO  Take 1 tablet by mouth daily.     docusate sodium 100 MG capsule  Commonly known as:  COLACE  1 tab 2 times a day while on narcotics.  STOOL SOFTENER     doxazosin 4 MG tablet  Commonly known as:  CARDURA  Take 4 mg by mouth at bedtime.     enoxaparin 30 MG/0.3ML injection  Commonly known as:  LOVENOX  Inject 0.3 mLs (30 mg total) into the skin every 12 (twelve) hours.     fluticasone 50 MCG/ACT nasal spray  Commonly known as:  FLONASE  Place 2 sprays into both nostrils daily as needed for allergies or rhinitis.     losartan-hydrochlorothiazide 100-25 MG tablet  Commonly known as:  HYZAAR   Take 1 tablet by mouth daily.     MAGNESIUM GLYCINATE PLUS PO  Take 1 tablet by mouth daily.     multivitamin with minerals Tabs tablet  Take 1 tablet by mouth daily.     OVER THE COUNTER MEDICATION  Take 3 sprays by mouth daily. Vitamin D Spray 1000 units     oxybutynin 5 MG tablet  Commonly known as:  DITROPAN  Take 5 mg by mouth daily.     oxyCODONE 5 MG immediate release tablet  Commonly known as:  Oxy IR/ROXICODONE  1-2 tablets every 4-6 hrs as needed for pain     pantoprazole 40 MG tablet  Commonly known as:  PROTONIX  Take 40 mg by mouth every morning.     polyethylene glycol packet  Commonly known as:  MIRALAX / GLYCOLAX  17grams in 16 oz of water twice a day until bowel movement.  LAXITIVE.  Restart if two days since last bowel movement     pravastatin 40 MG tablet  Commonly known as:  PRAVACHOL  Take 40 mg by mouth daily.     PROBIOTIC & ACIDOPHILUS EX ST PO  Take 1 tablet by mouth daily.     ranitidine 300 MG tablet  Commonly known as:  ZANTAC  Take 300 mg by mouth at bedtime.     TUMS ULTRA 1000 PO  Take 1,000 mg by mouth 2 (two) times daily.     vitamin B-12 1000 MCG tablet  Commonly known as:  CYANOCOBALAMIN  Take 1,000 mcg by mouth daily.        Diagnostic Studies: Dg Knee 1-2 Views Right  06/25/2015   CLINICAL DATA:  Total knee arthroplasty with open reduction and internal fixation of the right medial femoral condyle.  EXAM: RIGHT KNEE - 1-2 VIEW; DG C-ARM 61-120 MIN  COMPARISON:  Right knee MR dated 02/21/2010.  FINDINGS: Two AP and 1 lateral C-arm view of the right knee demonstrate an interval total knee prosthesis. There are also 2 transverse screws extending across the distal femoral metaphysis.  IMPRESSION: Screw and total lead prosthesis placement, as described above.   Electronically Signed   By: Beckie Salts M.D.   On: 06/25/2015 16:03   Dg C-arm 1-60 Min  06/25/2015   CLINICAL DATA:  Total knee arthroplasty with open reduction and  internal fixation of the right medial femoral condyle.  EXAM: RIGHT KNEE - 1-2 VIEW; DG C-ARM 61-120 MIN  COMPARISON:  Right knee MR dated 02/21/2010.  FINDINGS:  Two AP and 1 lateral C-arm view of the right knee demonstrate an interval total knee prosthesis. There are also 2 transverse screws extending across the distal femoral metaphysis.  IMPRESSION: Screw and total lead prosthesis placement, as described above.   Electronically Signed   By: Beckie Salts M.D.   On: 06/25/2015 16:03    Disposition: 01-Home or Self Care      Discharge Instructions    CPM    Complete by:  As directed   Continuous passive motion machine (CPM):      Use the CPM from 0 to 90 for 6 hours per day.       You may break it up into 2 or 3 sessions per day.      Use CPM for 2 weeks or until you are told to stop.     Call MD / Call 911    Complete by:  As directed   If you experience chest pain or shortness of breath, CALL 911 and be transported to the hospital emergency room.  If you develope a fever above 101 F, pus (white drainage) or increased drainage or redness at the wound, or calf pain, call your surgeon's office.     Change dressing    Complete by:  As directed   Change the gauze dressing daily with sterile 4 x 4 inch gauze and apply TED hose.  DO NOT REMOVE BANDAGE OVER SURGICAL INCISION.  WASH WHOLE LEG INCLUDING OVER THE WATERPROOF BANDAGE WITH SOAP AND WATER EVERY DAY.     Constipation Prevention    Complete by:  As directed   Drink plenty of fluids.  Prune juice may be helpful.  You may use a stool softener, such as Colace (over the counter) 100 mg twice a day.  Use MiraLax (over the counter) for constipation as needed.     Diet - low sodium heart healthy    Complete by:  As directed      Discharge instructions    Complete by:  As directed   INSTRUCTIONS AFTER JOINT REPLACEMENT   Remove items at home which could result in a fall. This includes throw rugs or furniture in walking pathways ICE to the  affected joint every three hours while awake for 30 minutes at a time, for at least the first 3-5 days, and then as needed for pain and swelling.  Continue to use ice for pain and swelling. You may notice swelling that will progress down to the foot and ankle.  This is normal after surgery.  Elevate your leg when you are not up walking on it.   Continue to use the breathing machine you got in the hospital (incentive spirometer) which will help keep your temperature down.  It is common for your temperature to cycle up and down following surgery, especially at night when you are not up moving around and exerting yourself.  The breathing machine keeps your lungs expanded and your temperature down.   DIET:  As you were doing prior to hospitalization, we recommend a well-balanced diet.  DRESSING / WOUND CARE / SHOWERING  Keep the surgical dressing until follow up.  The dressing is water proof, so you can shower without any extra covering.  IF THE DRESSING FALLS OFF or the wound gets wet inside, change the dressing with sterile gauze.  Please use good hand washing techniques before changing the dressing.  Do not use any lotions or creams on the incision until instructed by your surgeon.  ACTIVITY  Increase activity slowly as tolerated, but follow the weight bearing instructions below.   No driving for 6 weeks or until further direction given by your physician.  You cannot drive while taking narcotics.  No lifting or carrying greater than 10 lbs. until further directed by your surgeon. Avoid periods of inactivity such as sitting longer than an hour when not asleep. This helps prevent blood clots.  You may return to work once you are authorized by your doctor.     WEIGHT BEARING   Partial weight bearing with assist device as directed.  TOUCHDOWN WEIGHTBEARING.  PATIENT HAD A MEDIAL FEMORAL CONDYLE INSUFFICIENCY FRACTURE THAT WAS FIXED INTRAOP   EXERCISES  Results after joint replacement surgery  are often greatly improved when you follow the exercise, range of motion and muscle strengthening exercises prescribed by your doctor. Safety measures are also important to protect the joint from further injury. Any time any of these exercises cause you to have increased pain or swelling, decrease what you are doing until you are comfortable again and then slowly increase them. If you have problems or questions, call your caregiver or physical therapist for advice.   Rehabilitation is important following a joint replacement. After just a few days of immobilization, the muscles of the leg can become weakened and shrink (atrophy).  These exercises are designed to build up the tone and strength of the thigh and leg muscles and to improve motion. Often times heat used for twenty to thirty minutes before working out will loosen up your tissues and help with improving the range of motion but do not use heat for the first two weeks following surgery (sometimes heat can increase post-operative swelling).   These exercises can be done on a training (exercise) mat, on the floor, on a table or on a bed. Use whatever works the best and is most comfortable for you.    Use music or television while you are exercising so that the exercises are a pleasant break in your day. This will make your life better with the exercises acting as a break in your routine that you can look forward to.   Perform all exercises about fifteen times, three times per day or as directed.  You should exercise both the operative leg and the other leg as well.   Exercises include:   Quad Sets - Tighten up the muscle on the front of the thigh (Quad) and hold for 5-10 seconds.   Straight Leg Raises - With your knee straight (if you were given a brace, keep it on), lift the leg to 60 degrees, hold for 3 seconds, and slowly lower the leg.  Perform this exercise against resistance later as your leg gets stronger.  Leg Slides: Lying on your back,  slowly slide your foot toward your buttocks, bending your knee up off the floor (only go as far as is comfortable). Then slowly slide your foot back down until your leg is flat on the floor again.  Angel Wings: Lying on your back spread your legs to the side as far apart as you can without causing discomfort.  Hamstring Strength:  Lying on your back, push your heel against the floor with your leg straight by tightening up the muscles of your buttocks.  Repeat, but this time bend your knee to a comfortable angle, and push your heel against the floor.  You may put a pillow under the heel to make it more comfortable if necessary.   A  rehabilitation program following joint replacement surgery can speed recovery and prevent re-injury in the future due to weakened muscles. Contact your doctor or a physical therapist for more information on knee rehabilitation.    CONSTIPATION  Constipation is defined medically as fewer than three stools per week and severe constipation as less than one stool per week.  Even if you have a regular bowel pattern at home, your normal regimen is likely to be disrupted due to multiple reasons following surgery.  Combination of anesthesia, postoperative narcotics, change in appetite and fluid intake all can affect your bowels.   YOU MUST use at least one of the following options; they are listed in order of increasing strength to get the job done.  They are all available over the counter, and you may need to use some, POSSIBLY even all of these options:    Drink plenty of fluids (prune juice may be helpful) and high fiber foods Colace 100 mg by mouth twice a day  Senokot for constipation as directed and as needed Dulcolax (bisacodyl), take with full glass of water  Miralax (polyethylene glycol) once or twice a day as needed.  If you have tried all these things and are unable to have a bowel movement in the first 3-4 days after surgery call either your surgeon or your primary  doctor.    If you experience loose stools or diarrhea, hold the medications until you stool forms back up.  If your symptoms do not get better within 1 week or if they get worse, check with your doctor.  If you experience "the worst abdominal pain ever" or develop nausea or vomiting, please contact the office immediately for further recommendations for treatment.   ITCHING:  If you experience itching with your medications, try taking only a single pain pill, or even half a pain pill at a time.  You can also use Benadryl over the counter for itching or also to help with sleep.   TED HOSE STOCKINGS:  Use stockings on both legs until for at least 2 weeks or as directed by physician office. They may be removed at night for sleeping.  MEDICATIONS:  See your medication summary on the "After Visit Summary" that nursing will review with you.  You may have some home medications which will be placed on hold until you complete the course of blood thinner medication.  It is important for you to complete the blood thinner medication as prescribed.  PRECAUTIONS:  If you experience chest pain or shortness of breath - call 911 immediately for transfer to the hospital emergency department.   If you develop a fever greater that 101 F, purulent drainage from wound, increased redness or drainage from wound, foul odor from the wound/dressing, or calf pain - CONTACT YOUR SURGEON.                                                   FOLLOW-UP APPOINTMENTS:  If you do not already have a post-op appointment, please call the office for an appointment to be seen by your surgeon.  Guidelines for how soon to be seen are listed in your "After Visit Summary", but are typically between 1-4 weeks after surgery.  OTHER INSTRUCTIONS:   Knee Replacement:  Do not place pillow under knee, focus on keeping the knee straight while resting. CPM instructions:  0-90 degrees, 2 hours in the morning, 2 hours in the afternoon, and 2 hours in the  evening. Place foam block, curve side up under heel at all times except when in CPM or when walking.  DO NOT modify, tear, cut, or change the foam block in any way.  MAKE SURE YOU:  Understand these instructions.  Get help right away if you are not doing well or get worse.    Thank you for letting us be a part of your medical care team.  It is a privilege we respect greatly.  We hope these instructions will help you stay on track for a fast and full recovery!     Do not put a pillow under the knee. Place it under the heel.    Complete by:  As directed   Place gray foam block, curve side up under heel at all times except when in CPM or when walking.  DO NOT modify, tear, cut, or change in any way the gray foam block.     Increase activity slowly as tolerated    Complete by:  As directed      TED hose    Complete by:  As directed   Use stockings (TED hose) for 2 weeks on both leg(s).  You may remove them at night for sleeping.           Follow-up Information    Follow up with Nilda Simmer, MD On 07/09/2015.   Specialty:  Orthopedic Surgery   Why:  appt time 3:30 pm   Contact information:   [address] [place] [address] [phone]       Follow up with [facility].   Why:  They will contact you to schedule home therapy visits.    Contact information:   [address] [place] [address] [phone]        Signed: Pascal Lux 06/27/2015, 1:07 PM

## 2015-06-27 NOTE — Progress Notes (Signed)
Physical Therapy Treatment Patient Details Name: Faith Scott MRN: [id number] DOB: 1940-10-22 Today's Date: 06/27/2015    History of Present Illness Patient is a 74 y/o female s/p Rt TKA. PMH includes HTN, anxiety, MVP, Hepatitis, CKD, recent broken foot.    PT Comments    Patient progressing well towards PT goals. This session focused on family training on steps with spouse present. Pt requires Min A for balance/safety when negotiating stairs. Discussed car transfer technique. Pt safe to discharge from a mobility stand point. Will continue to follow if still in hospital tomorrow.   Follow Up Recommendations  Home health PT;Supervision/Assistance - 24 hour     Equipment Recommendations  Rolling walker with 5" wheels    Recommendations for Other Services       Precautions / Restrictions Precautions Precautions: Fall;Knee Precaution Booklet Issued: Yes (comment) Precaution Comments: Reviewed knee precautions and no pillow under knee. Restrictions Weight Bearing Restrictions: Yes RLE Weight Bearing: Touchdown weight bearing Other Position/Activity Restrictions: Spoke with PA, Baxter Hire- states she wants pt's foot flat on floor during gait but still TDWB ("a little bit of weight.")    Mobility  Bed Mobility Overal bed mobility: Needs Assistance Bed Mobility: Sit to Supine     Supine to sit: Modified independent (Device/Increase time)     General bed mobility comments: HOB flat, no use of rails to simulate home.  Transfers Overall transfer level: Needs assistance Equipment used: Rolling walker (2 wheeled) Transfers: Sit to/from Stand Sit to Stand: Supervision         General transfer comment: Stood from chair x2.  Ambulation/Gait Ambulation/Gait assistance: Supervision Ambulation Distance (Feet): 20 Feet Assistive device: Rolling walker (2 wheeled) Gait Pattern/deviations: Step-to pattern;Decreased stance time - right;Decreased step length - left;Trunk flexed    Gait velocity interpretation: <1.8 ft/sec, indicative of risk for recurrent falls General Gait Details: Cues to maintain TDWB during gait.  Cues for RW management/proximity.    Stairs Stairs: Yes Stairs assistance: Min assist Stair Management: Backwards;With walker Number of Stairs: 3 (x2 bouts) General stair comments: performed family training with spouse present- reviewed hand placement and positioning. Min A to stabilize RW, for balance and to use UEs to offload RLE to maintain TDWB. Some difficulty maintaining TDWB during stair negotiation.   Wheelchair Mobility    Modified Rankin (Stroke Patients Only)       Balance Overall balance assessment: Needs assistance Sitting-balance support: Feet supported;No upper extremity supported Sitting balance-Leahy Scale: Good     Standing balance support: During functional activity Standing balance-Leahy Scale: Fair Standing balance comment: Able to pull up underware and pants without UE support                    Cognition Arousal/Alertness: Awake/alert Behavior During Therapy: WFL for tasks assessed/performed Overall Cognitive Status: Within Functional Limits for tasks assessed                      Exercises Total Joint Exercises     General Comments General comments (skin integrity, edema, etc.): Spouse present.      Pertinent Vitals/Pain Pain Assessment: Faces Faces Pain Scale: Hurts a little bit Pain Location: right knee Pain Descriptors / Indicators: Sore Pain Intervention(s): Monitored during session;Repositioned;Ice applied    Home Living                      Prior Function            PT Goals (  current goals can now be found in the care plan section) Acute Rehab PT Goals Patient Stated Goal: none stated Progress towards PT goals: Progressing toward goals    Frequency  7X/week    PT Plan Current plan remains appropriate    Co-evaluation             End of Session  Equipment Utilized During Treatment: Gait belt Activity Tolerance: Patient tolerated treatment well Patient left: in bed;with call bell/phone within reach;with family/visitor present     Time: 1308-6578 PT Time Calculation (min) (ACUTE ONLY): 19 min  Charges:  $Gait Training: 8-22 mins                    G Codes:      Shauna A Hartshorne 06/27/2015, 2:10 PM  Mylo Red, PT, DPT [phone]

## 2015-06-27 NOTE — Progress Notes (Signed)
Physical Therapy Treatment Patient Details Name: Faith Scott MRN: [id number] DOB: 1941-05-28 Today's Date: 06/27/2015    History of Present Illness Patient is a 74 y/o female s/p Rt TKA. PMH includes HTN, anxiety, MVP, Hepatitis, CKD, recent broken foot.    PT Comments    Patient progressing well towards PT goals. Spouse present during session to know how to assist at home. Reviewed exercises. Explained importance of short distance ambulation at home due to TDWB status and trying to utilize UEs to offload RLE as much as possible for healing. Pt safely able to get on/off low toilet without assist - as BSC probably won't fit over toilet. Will plan for stair training in PM with spouse present to prepare pt for discharge home. Will follow acutely.   Follow Up Recommendations  Home health PT;Supervision/Assistance - 24 hour     Equipment Recommendations  Rolling walker with 5" wheels    Recommendations for Other Services       Precautions / Restrictions Precautions Precautions: Fall;Knee Precaution Booklet Issued: Yes (comment) Precaution Comments: Reviewed knee precautions and no pillow under knee. Restrictions Weight Bearing Restrictions: Yes RLE Weight Bearing: Touchdown weight bearing Other Position/Activity Restrictions: Spoke with PA, Baxter Hire- states she wants pt's foot flat on floor during gait but still TDWB ("a little bit of weight.")    Mobility  Bed Mobility               General bed mobility comments: Sitting in recliner upon PT arrival.   Transfers Overall transfer level: Needs assistance Equipment used: Rolling walker (2 wheeled) Transfers: Sit to/from Stand Sit to Stand: Supervision         General transfer comment: Supervision for safety. Stood from chair x2, from low toilet x1 to simulate home.   Ambulation/Gait Ambulation/Gait assistance: Min guard Ambulation Distance (Feet): 75 Feet Assistive device: Rolling walker (2 wheeled) Gait  Pattern/deviations: Step-to pattern;Decreased stance time - right;Decreased step length - left;Antalgic;Trunk flexed   Gait velocity interpretation: <1.8 ft/sec, indicative of risk for recurrent falls General Gait Details: Cues to maintain TDWB during gait.  Cues for RW management/proximity.    Stairs            Wheelchair Mobility    Modified Rankin (Stroke Patients Only)       Balance Overall balance assessment: Needs assistance Sitting-balance support: Feet supported;No upper extremity supported Sitting balance-Leahy Scale: Good     Standing balance support: During functional activity Standing balance-Leahy Scale: Fair Standing balance comment: tolerated washing hands at sink without UE support for short period.                    Cognition Arousal/Alertness: Awake/alert Behavior During Therapy: WFL for tasks assessed/performed Overall Cognitive Status: Within Functional Limits for tasks assessed                      Exercises Total Joint Exercises Quad Sets: Both;10 reps;Seated Heel Slides: Right;10 reps;Seated Hip ABduction/ADduction: Right;10 reps;Seated Straight Leg Raises: Right;10 reps;Seated Goniometric ROM: 2-80 degrees knee AROM    General Comments General comments (skin integrity, edema, etc.): Spouse present during session.      Pertinent Vitals/Pain Pain Assessment: Faces Faces Pain Scale: Hurts little more Pain Location: right knee Pain Descriptors / Indicators: Sore Pain Intervention(s): Monitored during session;Repositioned    Home Living                      Prior Function  PT Goals (current goals can now be found in the care plan section) Progress towards PT goals: Progressing toward goals    Frequency  7X/week    PT Plan Current plan remains appropriate    Co-evaluation             End of Session Equipment Utilized During Treatment: Gait belt Activity Tolerance: Patient tolerated  treatment well Patient left: in chair;with call bell/phone within reach;with family/visitor present     Time: 1610-9604 PT Time Calculation (min) (ACUTE ONLY): 22 min  Charges:  $Gait Training: 8-22 mins                    G Codes:      Shauna A Hartshorne 06/27/2015, 11:22 AM Mylo Red, PT, DPT [phone]

## 2015-09-19 ENCOUNTER — Emergency Department (HOSPITAL_COMMUNITY): Payer: No Typology Code available for payment source

## 2015-09-19 ENCOUNTER — Emergency Department (HOSPITAL_COMMUNITY)
Admission: EM | Admit: 2015-09-19 | Discharge: 2015-09-19 | Disposition: A | Discharge status: Home / Self Care | Payer: No Typology Code available for payment source | Attending: Emergency Medicine | Admitting: Emergency Medicine

## 2015-09-19 ENCOUNTER — Encounter (HOSPITAL_COMMUNITY): Payer: Self-pay

## 2015-09-19 DIAGNOSIS — E039 Hypothyroidism, unspecified: Secondary | ICD-10-CM | POA: Diagnosis not present

## 2015-09-19 DIAGNOSIS — Z8659 Personal history of other mental and behavioral disorders: Secondary | ICD-10-CM | POA: Diagnosis not present

## 2015-09-19 DIAGNOSIS — E7800 Pure hypercholesterolemia, unspecified: Secondary | ICD-10-CM | POA: Insufficient documentation

## 2015-09-19 DIAGNOSIS — N182 Chronic kidney disease, stage 2 (mild): Secondary | ICD-10-CM | POA: Insufficient documentation

## 2015-09-19 DIAGNOSIS — Z8709 Personal history of other diseases of the respiratory system: Secondary | ICD-10-CM | POA: Insufficient documentation

## 2015-09-19 DIAGNOSIS — I129 Hypertensive chronic kidney disease with stage 1 through stage 4 chronic kidney disease, or unspecified chronic kidney disease: Secondary | ICD-10-CM | POA: Diagnosis not present

## 2015-09-19 DIAGNOSIS — Y92410 Unspecified street and highway as the place of occurrence of the external cause: Secondary | ICD-10-CM | POA: Insufficient documentation

## 2015-09-19 DIAGNOSIS — K219 Gastro-esophageal reflux disease without esophagitis: Secondary | ICD-10-CM | POA: Insufficient documentation

## 2015-09-19 DIAGNOSIS — M1711 Unilateral primary osteoarthritis, right knee: Secondary | ICD-10-CM | POA: Insufficient documentation

## 2015-09-19 DIAGNOSIS — S8011XA Contusion of right lower leg, initial encounter: Secondary | ICD-10-CM | POA: Diagnosis not present

## 2015-09-19 DIAGNOSIS — S20212A Contusion of left front wall of thorax, initial encounter: Secondary | ICD-10-CM | POA: Diagnosis not present

## 2015-09-19 DIAGNOSIS — Y9389 Activity, other specified: Secondary | ICD-10-CM | POA: Diagnosis not present

## 2015-09-19 DIAGNOSIS — Z8781 Personal history of (healed) traumatic fracture: Secondary | ICD-10-CM | POA: Insufficient documentation

## 2015-09-19 DIAGNOSIS — Z87891 Personal history of nicotine dependence: Secondary | ICD-10-CM | POA: Diagnosis not present

## 2015-09-19 DIAGNOSIS — Z8701 Personal history of pneumonia (recurrent): Secondary | ICD-10-CM | POA: Diagnosis not present

## 2015-09-19 DIAGNOSIS — Z79899 Other long term (current) drug therapy: Secondary | ICD-10-CM | POA: Diagnosis not present

## 2015-09-19 DIAGNOSIS — Y998 Other external cause status: Secondary | ICD-10-CM | POA: Insufficient documentation

## 2015-09-19 DIAGNOSIS — Z8669 Personal history of other diseases of the nervous system and sense organs: Secondary | ICD-10-CM | POA: Diagnosis not present

## 2015-09-19 DIAGNOSIS — S161XXA Strain of muscle, fascia and tendon at neck level, initial encounter: Secondary | ICD-10-CM

## 2015-09-19 DIAGNOSIS — S199XXA Unspecified injury of neck, initial encounter: Secondary | ICD-10-CM | POA: Diagnosis present

## 2015-09-19 MED ORDER — METHOCARBAMOL 500 MG PO TABS: 500.0000 mg | ORAL_TABLET | Freq: Four times a day (QID) | ORAL | Status: DC

## 2015-09-19 MED ORDER — ONDANSETRON 4 MG PO TBDP
4.0000 mg | ORAL_TABLET | Freq: Once | ORAL | Status: AC
  Administered 2015-09-19: 4 mg via ORAL
  Filled 2015-09-19: qty 1

## 2015-09-19 NOTE — ED Notes (Signed)
Pt returned form radiology
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
## 2015-09-19 NOTE — ED Notes (Signed)
Patient transported to X-ray 
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
## 2015-09-19 NOTE — ED Notes (Signed)
Josh Geiple, PA at bedside 

## 2016-02-29 ENCOUNTER — Other Ambulatory Visit: Payer: Self-pay | Admitting: Family Medicine

## 2016-02-29 DIAGNOSIS — Z1231 Encounter for screening mammogram for malignant neoplasm of breast: Secondary | ICD-10-CM

## 2016-04-09 ENCOUNTER — Ambulatory Visit: Payer: Medicare Other

## 2016-04-30 ENCOUNTER — Ambulatory Visit
Admission: RE | Admit: 2016-04-30 | Discharge: 2016-04-30 | Disposition: A | Discharge status: Home / Self Care | Payer: Medicare Other | Source: Ambulatory Visit | Attending: Family Medicine | Admitting: Family Medicine

## 2016-04-30 DIAGNOSIS — Z1231 Encounter for screening mammogram for malignant neoplasm of breast: Secondary | ICD-10-CM

## 2016-12-11 ENCOUNTER — Other Ambulatory Visit: Payer: Self-pay | Admitting: Orthopedic Surgery

## 2016-12-11 DIAGNOSIS — M5410 Radiculopathy, site unspecified: Secondary | ICD-10-CM

## 2016-12-17 ENCOUNTER — Ambulatory Visit
Admission: RE | Admit: 2016-12-17 | Discharge: 2016-12-17 | Disposition: A | Discharge status: Home / Self Care | Payer: Medicare Other | Source: Ambulatory Visit | Attending: Orthopedic Surgery | Admitting: Orthopedic Surgery

## 2016-12-17 DIAGNOSIS — M5410 Radiculopathy, site unspecified: Secondary | ICD-10-CM

## 2017-05-11 ENCOUNTER — Other Ambulatory Visit: Payer: Self-pay | Admitting: Neurosurgery

## 2017-06-23 ENCOUNTER — Encounter (HOSPITAL_COMMUNITY)
Admission: RE | Admit: 2017-06-23 | Discharge: 2017-06-23 | Disposition: A | Discharge status: Home / Self Care | Payer: Medicare Other | Source: Ambulatory Visit | Attending: Neurosurgery | Admitting: Neurosurgery

## 2017-06-23 ENCOUNTER — Encounter (HOSPITAL_COMMUNITY): Payer: Self-pay

## 2017-06-23 DIAGNOSIS — Z01810 Encounter for preprocedural cardiovascular examination: Secondary | ICD-10-CM | POA: Insufficient documentation

## 2017-06-23 DIAGNOSIS — E039 Hypothyroidism, unspecified: Secondary | ICD-10-CM | POA: Insufficient documentation

## 2017-06-23 DIAGNOSIS — I120 Hypertensive chronic kidney disease with stage 5 chronic kidney disease or end stage renal disease: Secondary | ICD-10-CM

## 2017-06-23 DIAGNOSIS — Z01812 Encounter for preprocedural laboratory examination: Secondary | ICD-10-CM

## 2017-06-23 DIAGNOSIS — N182 Chronic kidney disease, stage 2 (mild): Secondary | ICD-10-CM | POA: Insufficient documentation

## 2017-06-23 DIAGNOSIS — M1711 Unilateral primary osteoarthritis, right knee: Secondary | ICD-10-CM

## 2017-06-23 DIAGNOSIS — E7800 Pure hypercholesterolemia, unspecified: Secondary | ICD-10-CM

## 2017-06-23 LAB — TYPE AND SCREEN
ABO/RH(D): A POS
Antibody Screen: NEGATIVE

## 2017-06-23 LAB — CBC
HCT: 42.6 % (ref 36.0–46.0)
HEMOGLOBIN: 15.2 g/dL — AB (ref 12.0–15.0)
MCH: 30 pg (ref 26.0–34.0)
MCHC: 35.7 g/dL (ref 30.0–36.0)
MCV: 84.2 fL (ref 78.0–100.0)
PLATELETS: 228 10*3/uL (ref 150–400)
RBC: 5.06 MIL/uL (ref 3.87–5.11)
RDW: 13.6 % (ref 11.5–15.5)
WBC: 7.9 10*3/uL (ref 4.0–10.5)

## 2017-06-23 LAB — COMPREHENSIVE METABOLIC PANEL
ALBUMIN: 4 g/dL (ref 3.5–5.0)
ALK PHOS: 58 U/L (ref 38–126)
ALT: 17 U/L (ref 14–54)
AST: 25 U/L (ref 15–41)
Anion gap: 8 (ref 5–15)
BUN: 14 mg/dL (ref 6–20)
CALCIUM: 9 mg/dL (ref 8.9–10.3)
CHLORIDE: 103 mmol/L (ref 101–111)
CO2: 25 mmol/L (ref 22–32)
CREATININE: 0.82 mg/dL (ref 0.44–1.00)
GFR calc Af Amer: >60 mL/min (ref >60)
GFR calc non Af Amer: >60 mL/min (ref >60)
GLUCOSE: 93 mg/dL (ref 65–99)
Potassium: 3.3 mmol/L — ABNORMAL LOW (ref 3.5–5.1)
SODIUM: 136 mmol/L (ref 135–145)
Total Bilirubin: 0.8 mg/dL (ref 0.3–1.2)
Total Protein: 6.6 g/dL (ref 6.5–8.1)

## 2017-06-23 LAB — SURGICAL PCR SCREEN
MRSA, PCR: NEGATIVE
STAPHYLOCOCCUS AUREUS: NEGATIVE

## 2017-06-23 NOTE — Pre-Procedure Instructions (Addendum)
SHAVONA GUNDERMAN  06/23/2017      [id number] - [place], [place] - [address] [place] [address] Phone: [phone] Fax: [phone]  [facility] - [place], [place] - [name] Portal to [facility] [address] [place] [address] Phone: [phone] Fax: [phone]  [facility] - [place], [place] - [address] [place] [address] Phone: [phone] Fax: [phone]    Your procedure is scheduled on   Thursday  06/25/17  Report to [facility] Admitting at  530   A.M.  Call this number if you have problems the morning of surgery:  [phone]   Remember:  Do not eat food or drink liquids after midnight.  Take these medicines the morning of surgery with A SIP OF WATER   AMLODIPINE (NORVASC), OXYBUTYNIN (DITROPAN), RANITIDINE (ZANTAC), EYE DROPS  7 days prior to surgery STOP taking any Aspirin, Aleve, Naproxen, Ibuprofen, Motrin, Advil, Goody's, BC's, all herbal medications, fish oil, and all vitamins   Do not wear jewelry, make-up or nail polish.  Do not wear lotions, powders, or perfumes, or deoderant.  Do not shave 48 hours prior to surgery.  Men may shave face and neck.  Do not bring valuables to the hospital.  [facility] is not responsible for any belongings or valuables.  Contacts, dentures or bridgework may not be worn into surgery.  Leave your suitcase in the car.  After surgery it may be brought to your room.  For patients admitted to the hospital, discharge time will be determined by your treatment team.  Patients discharged the day of surgery will not be allowed to drive home.   Name and phone number of your driver:    Special instructions:  [facility] - Preparing for Surgery  Before surgery, you can play an important role.  Because skin is not sterile, your skin needs to be as free of germs as  possible.  You can reduce the number of germs on you skin by washing with CHG (chlorahexidine gluconate) soap before surgery.  CHG is an antiseptic cleaner which kills germs and bonds with the skin to continue killing germs even after washing.  Please DO NOT use if you have an allergy to CHG or antibacterial soaps.  If your skin becomes reddened/irritated stop using the CHG and inform your nurse when you arrive at Short Stay.  Do not shave (including legs and underarms) for at least 48 hours prior to the first CHG shower.  You may shave your face.  Please follow these instructions carefully:   1.  Shower with CHG Soap the night before surgery and the                                morning of Surgery.  2.  If you choose to wash your hair, wash your hair first as usual with your       normal shampoo.  3.  After you shampoo, rinse your hair and body thoroughly to remove the                      Shampoo.  4.  Use CHG as you would any other liquid soap.  You can apply chg directly       to the skin and wash gently with  scrungie or a clean washcloth.  5.  Apply the CHG Soap to your body ONLY FROM THE NECK DOWN.        Do not use on open wounds or open sores.  Avoid contact with your eyes,       ears, mouth and genitals (private parts).  Wash genitals (private parts)       with your normal soap.  6.  Wash thoroughly, paying special attention to the area where your surgery        will be performed.  7.  Thoroughly rinse your body with warm water from the neck down.  8.  DO NOT shower/wash with your normal soap after using and rinsing off       the CHG Soap.  9.  Pat yourself dry with a clean towel.            10.  Wear clean pajamas.            11.  Place clean sheets on your bed the night of your first shower and do not        sleep with pets.  Day of Surgery  Do not apply any lotions/deoderants the morning of surgery.  Please wear clean clothes to the hospital/surgery center.    Please read over  the following fact sheets that you were given. MRSA Information and Surgical Site Infection Prevention

## 2017-06-24 NOTE — Progress Notes (Signed)
Anesthesia Chart Review:  Pt is a 76 year old female scheduled for L3-4 laminectomy and decompression with pedicle screws and rod placement, interbody prosthesis, posterior lateral arthrodesis on 06/25/2017 with Tressie Stalker, MD  PMH includes:  HTN, MVP, hyperlipidemia, OSA, CKD, hypothyroidism, GERD. Former smoker. BMI 33. S/p R TKA, ORIF R medial femoral condyle 06/25/15.   Medications include: Amlodipine, losartan-HCTZ, Zantac  Preoperative labs reviewed.    EKG 06/23/17: NSR. LAD. Inferior infarct, age undetermined  Nuclear stress test 12/12/2014 ([place] [facility]): 1. Normal nuclear stress test 2. Normal EF calculated to be 73% 3. Normal gated images.   If no changes, I anticipate pt can proceed with surgery as scheduled.   Rica Mast, FNP-BC [facility]/Anesthesiology Phone: [phone] 06/24/2017 10:03 AM

## 2017-06-25 ENCOUNTER — Encounter (HOSPITAL_COMMUNITY)
Admission: RE | Disposition: A | Discharge status: Home / Self Care | Payer: Self-pay | Source: Ambulatory Visit | Attending: Neurosurgery

## 2017-06-25 ENCOUNTER — Inpatient Hospital Stay (HOSPITAL_COMMUNITY): Payer: Medicare Other

## 2017-06-25 ENCOUNTER — Inpatient Hospital Stay (HOSPITAL_COMMUNITY): Payer: Medicare Other | Admitting: Emergency Medicine

## 2017-06-25 ENCOUNTER — Encounter (HOSPITAL_COMMUNITY): Payer: Self-pay | Admitting: Certified Registered Nurse Anesthetist

## 2017-06-25 ENCOUNTER — Inpatient Hospital Stay (HOSPITAL_COMMUNITY): Payer: Medicare Other | Admitting: Anesthesiology

## 2017-06-25 ENCOUNTER — Inpatient Hospital Stay (HOSPITAL_COMMUNITY)
Admission: RE | Admit: 2017-06-25 | Discharge: 2017-06-28 | DRG: 455 | Disposition: A | Discharge status: Home / Self Care | Payer: Medicare Other | Source: Ambulatory Visit | Attending: Neurosurgery | Admitting: Neurosurgery

## 2017-06-25 DIAGNOSIS — Z96651 Presence of right artificial knee joint: Secondary | ICD-10-CM | POA: Diagnosis present

## 2017-06-25 DIAGNOSIS — Z832 Family history of diseases of the blood and blood-forming organs and certain disorders involving the immune mechanism: Secondary | ICD-10-CM

## 2017-06-25 DIAGNOSIS — Z823 Family history of stroke: Secondary | ICD-10-CM

## 2017-06-25 DIAGNOSIS — Z8249 Family history of ischemic heart disease and other diseases of the circulatory system: Secondary | ICD-10-CM

## 2017-06-25 DIAGNOSIS — M48061 Spinal stenosis, lumbar region without neurogenic claudication: Secondary | ICD-10-CM | POA: Diagnosis present

## 2017-06-25 DIAGNOSIS — E7800 Pure hypercholesterolemia, unspecified: Secondary | ICD-10-CM | POA: Diagnosis present

## 2017-06-25 DIAGNOSIS — Z87891 Personal history of nicotine dependence: Secondary | ICD-10-CM

## 2017-06-25 DIAGNOSIS — G8929 Other chronic pain: Secondary | ICD-10-CM | POA: Diagnosis present

## 2017-06-25 DIAGNOSIS — I129 Hypertensive chronic kidney disease with stage 1 through stage 4 chronic kidney disease, or unspecified chronic kidney disease: Secondary | ICD-10-CM | POA: Diagnosis present

## 2017-06-25 DIAGNOSIS — M5116 Intervertebral disc disorders with radiculopathy, lumbar region: Secondary | ICD-10-CM | POA: Diagnosis present

## 2017-06-25 DIAGNOSIS — Z419 Encounter for procedure for purposes other than remedying health state, unspecified: Secondary | ICD-10-CM

## 2017-06-25 DIAGNOSIS — M4316 Spondylolisthesis, lumbar region: Secondary | ICD-10-CM | POA: Diagnosis present

## 2017-06-25 SURGERY — POSTERIOR LUMBAR FUSION 1 LEVEL
Anesthesia: General | Site: Spine Lumbar

## 2017-06-25 MED ORDER — MENTHOL 3 MG MT LOZG: 1.0000 | LOZENGE | OROMUCOSAL | Status: DC | PRN

## 2017-06-25 MED ORDER — BUPIVACAINE LIPOSOME 1.3 % IJ SUSP
INTRAMUSCULAR | Status: DC | PRN
  Administered 2017-06-25: 20 mL

## 2017-06-25 MED ORDER — PHENYLEPHRINE HCL 10 MG/ML IJ SOLN
INTRAMUSCULAR | Status: DC | PRN
  Administered 2017-06-25: 80 ug via INTRAVENOUS

## 2017-06-25 MED ORDER — MIDAZOLAM HCL 2 MG/2ML IJ SOLN
INTRAMUSCULAR | Status: AC
  Filled 2017-06-25: qty 2

## 2017-06-25 MED ORDER — DEXAMETHASONE SODIUM PHOSPHATE 10 MG/ML IJ SOLN
INTRAMUSCULAR | Status: DC | PRN
  Administered 2017-06-25: 10 mg via INTRAVENOUS

## 2017-06-25 MED ORDER — THROMBIN 5000 UNITS EX SOLR
CUTANEOUS | Status: AC
  Filled 2017-06-25: qty 5000

## 2017-06-25 MED ORDER — HYDROMORPHONE HCL 1 MG/ML IJ SOLN
INTRAMUSCULAR | Status: AC
  Administered 2017-06-25: 0.5 mg via INTRAVENOUS
  Filled 2017-06-25: qty 1

## 2017-06-25 MED ORDER — BACITRACIN ZINC 500 UNIT/GM EX OINT
TOPICAL_OINTMENT | CUTANEOUS | Status: DC | PRN
  Administered 2017-06-25: 1 via TOPICAL

## 2017-06-25 MED ORDER — BACITRACIN ZINC 500 UNIT/GM EX OINT
TOPICAL_OINTMENT | CUTANEOUS | Status: AC
  Filled 2017-06-25: qty 28.35

## 2017-06-25 MED ORDER — ONDANSETRON HCL 4 MG/2ML IJ SOLN
INTRAMUSCULAR | Status: AC
  Filled 2017-06-25: qty 2

## 2017-06-25 MED ORDER — OXYCODONE HCL 5 MG PO TABS
5.0000 mg | ORAL_TABLET | ORAL | Status: DC | PRN
  Administered 2017-06-25 – 2017-06-28 (×15): 10 mg via ORAL
  Filled 2017-06-25 (×13): qty 2

## 2017-06-25 MED ORDER — ACETAMINOPHEN 325 MG PO TABS
ORAL_TABLET | ORAL | Status: AC
  Administered 2017-06-25: 650 mg via ORAL
  Filled 2017-06-25: qty 2

## 2017-06-25 MED ORDER — THROMBIN 20000 UNITS EX SOLR
CUTANEOUS | Status: AC
  Filled 2017-06-25: qty 20000

## 2017-06-25 MED ORDER — VANCOMYCIN HCL 1000 MG IV SOLR
INTRAVENOUS | Status: AC
  Filled 2017-06-25: qty 1000

## 2017-06-25 MED ORDER — FENTANYL CITRATE (PF) 100 MCG/2ML IJ SOLN
INTRAMUSCULAR | Status: DC | PRN
  Administered 2017-06-25: 50 ug via INTRAVENOUS
  Administered 2017-06-25: 150 ug via INTRAVENOUS
  Administered 2017-06-25 (×2): 50 ug via INTRAVENOUS

## 2017-06-25 MED ORDER — PROPOFOL 10 MG/ML IV BOLUS
INTRAVENOUS | Status: AC
  Filled 2017-06-25: qty 20

## 2017-06-25 MED ORDER — ZOLPIDEM TARTRATE 5 MG PO TABS: 5.0000 mg | ORAL_TABLET | Freq: Every evening | ORAL | Status: DC | PRN

## 2017-06-25 MED ORDER — BUPIVACAINE-EPINEPHRINE (PF) 0.5% -1:200000 IJ SOLN
INTRAMUSCULAR | Status: DC | PRN
  Administered 2017-06-25: 10 mL

## 2017-06-25 MED ORDER — ACETAMINOPHEN 325 MG PO TABS
650.0000 mg | ORAL_TABLET | ORAL | Status: DC | PRN
  Administered 2017-06-25: 650 mg via ORAL

## 2017-06-25 MED ORDER — ALBUMIN HUMAN 5 % IV SOLN
INTRAVENOUS | Status: DC | PRN
Start: 2017-06-25 — End: 2017-06-25
  Administered 2017-06-25: 11:00:00 via INTRAVENOUS

## 2017-06-25 MED ORDER — ARTIFICIAL TEARS OPHTHALMIC OINT: TOPICAL_OINTMENT | OPHTHALMIC | Status: DC | PRN

## 2017-06-25 MED ORDER — CEFAZOLIN SODIUM-DEXTROSE 2-4 GM/100ML-% IV SOLN
2.0000 g | Freq: Three times a day (TID) | INTRAVENOUS | Status: AC
  Administered 2017-06-25 – 2017-06-26 (×2): 2 g via INTRAVENOUS
  Filled 2017-06-25 (×2): qty 100

## 2017-06-25 MED ORDER — ONDANSETRON HCL 4 MG/2ML IJ SOLN: 4.0000 mg | Freq: Four times a day (QID) | INTRAMUSCULAR | Status: DC | PRN

## 2017-06-25 MED ORDER — EPHEDRINE SULFATE 50 MG/ML IJ SOLN
INTRAMUSCULAR | Status: DC | PRN
  Administered 2017-06-25 (×3): 10 mg via INTRAVENOUS

## 2017-06-25 MED ORDER — PHENOL 1.4 % MT LIQD: 1.0000 | OROMUCOSAL | Status: DC | PRN

## 2017-06-25 MED ORDER — MORPHINE SULFATE (PF) 4 MG/ML IV SOLN
INTRAVENOUS | Status: AC
  Administered 2017-06-25: 4 mg via INTRAVENOUS
  Filled 2017-06-25: qty 1

## 2017-06-25 MED ORDER — FENTANYL CITRATE (PF) 250 MCG/5ML IJ SOLN
INTRAMUSCULAR | Status: AC
  Filled 2017-06-25: qty 5

## 2017-06-25 MED ORDER — THROMBIN 20000 UNITS EX SOLR
CUTANEOUS | Status: DC | PRN
  Administered 2017-06-25: 09:00:00 via TOPICAL

## 2017-06-25 MED ORDER — OXYCODONE HCL 5 MG PO TABS
ORAL_TABLET | ORAL | Status: AC
  Administered 2017-06-25: 10 mg via ORAL
  Filled 2017-06-25: qty 2

## 2017-06-25 MED ORDER — CHLORHEXIDINE GLUCONATE CLOTH 2 % EX PADS: 6.0000 | MEDICATED_PAD | Freq: Once | CUTANEOUS | Status: DC

## 2017-06-25 MED ORDER — PHENYLEPHRINE HCL 10 MG/ML IJ SOLN
INTRAVENOUS | Status: DC | PRN
  Administered 2017-06-25: 25 ug/min via INTRAVENOUS

## 2017-06-25 MED ORDER — DIPHENHYDRAMINE HCL 50 MG/ML IJ SOLN
6.2500 mg | Freq: Once | INTRAMUSCULAR | Status: AC
  Administered 2017-06-25: 6.5 mg via INTRAVENOUS

## 2017-06-25 MED ORDER — CEFAZOLIN SODIUM-DEXTROSE 2-4 GM/100ML-% IV SOLN
INTRAVENOUS | Status: AC
  Filled 2017-06-25: qty 100

## 2017-06-25 MED ORDER — ACETAMINOPHEN 650 MG RE SUPP: 650.0000 mg | RECTAL | Status: DC | PRN

## 2017-06-25 MED ORDER — ONDANSETRON HCL 4 MG PO TABS
4.0000 mg | ORAL_TABLET | Freq: Four times a day (QID) | ORAL | Status: DC | PRN
  Administered 2017-06-28: 4 mg via ORAL
  Filled 2017-06-25: qty 1

## 2017-06-25 MED ORDER — PHENYLEPHRINE 40 MCG/ML (10ML) SYRINGE FOR IV PUSH (FOR BLOOD PRESSURE SUPPORT)
PREFILLED_SYRINGE | INTRAVENOUS | Status: AC
  Filled 2017-06-25: qty 10

## 2017-06-25 MED ORDER — ACETAMINOPHEN 10 MG/ML IV SOLN
INTRAVENOUS | Status: AC
  Filled 2017-06-25: qty 100

## 2017-06-25 MED ORDER — ROCURONIUM BROMIDE 100 MG/10ML IV SOLN
INTRAVENOUS | Status: DC | PRN
  Administered 2017-06-25: 50 mg via INTRAVENOUS
  Administered 2017-06-25: 20 mg via INTRAVENOUS

## 2017-06-25 MED ORDER — HYDROMORPHONE HCL 1 MG/ML IJ SOLN
0.2500 mg | INTRAMUSCULAR | Status: DC | PRN
  Administered 2017-06-25 (×4): 0.5 mg via INTRAVENOUS

## 2017-06-25 MED ORDER — DIPHENHYDRAMINE HCL 50 MG/ML IJ SOLN
INTRAMUSCULAR | Status: AC
  Administered 2017-06-25: 6.5 mg via INTRAVENOUS
  Filled 2017-06-25: qty 1

## 2017-06-25 MED ORDER — LIDOCAINE HCL (CARDIAC) 20 MG/ML IV SOLN
INTRAVENOUS | Status: DC | PRN
  Administered 2017-06-25: 100 mg via INTRAVENOUS

## 2017-06-25 MED ORDER — CYCLOBENZAPRINE HCL 10 MG PO TABS
ORAL_TABLET | ORAL | Status: AC
  Administered 2017-06-25: 10 mg via ORAL
  Filled 2017-06-25: qty 1

## 2017-06-25 MED ORDER — CYCLOBENZAPRINE HCL 10 MG PO TABS
10.0000 mg | ORAL_TABLET | Freq: Three times a day (TID) | ORAL | Status: DC | PRN
  Administered 2017-06-25 – 2017-06-28 (×6): 10 mg via ORAL
  Filled 2017-06-25 (×6): qty 1

## 2017-06-25 MED ORDER — CEFAZOLIN SODIUM-DEXTROSE 2-4 GM/100ML-% IV SOLN
2.0000 g | INTRAVENOUS | Status: AC
  Administered 2017-06-25: 2 g via INTRAVENOUS

## 2017-06-25 MED ORDER — BISACODYL 10 MG RE SUPP: 10.0000 mg | Freq: Every day | RECTAL | Status: DC | PRN

## 2017-06-25 MED ORDER — GLYCOPYRROLATE 0.2 MG/ML IJ SOLN
INTRAMUSCULAR | Status: DC | PRN
  Administered 2017-06-25: 0.1 mg via INTRAVENOUS

## 2017-06-25 MED ORDER — MORPHINE SULFATE (PF) 4 MG/ML IV SOLN
INTRAVENOUS | Status: AC
Start: 2017-06-25 — End: 2017-06-25
  Administered 2017-06-25: 4 mg via INTRAVENOUS
  Filled 2017-06-25: qty 1

## 2017-06-25 MED ORDER — ACETAMINOPHEN 10 MG/ML IV SOLN
INTRAVENOUS | Status: DC | PRN
  Administered 2017-06-25: 1000 mg via INTRAVENOUS

## 2017-06-25 MED ORDER — BUPIVACAINE-EPINEPHRINE (PF) 0.5% -1:200000 IJ SOLN
INTRAMUSCULAR | Status: AC
  Filled 2017-06-25: qty 30

## 2017-06-25 MED ORDER — MIDAZOLAM HCL 5 MG/5ML IJ SOLN
INTRAMUSCULAR | Status: DC | PRN
  Administered 2017-06-25: 2 mg via INTRAVENOUS

## 2017-06-25 MED ORDER — PROPOFOL 10 MG/ML IV BOLUS
INTRAVENOUS | Status: DC | PRN
  Administered 2017-06-25: 160 mg via INTRAVENOUS

## 2017-06-25 MED ORDER — THROMBIN 5000 UNITS EX SOLR
OROMUCOSAL | Status: DC | PRN
  Administered 2017-06-25: 09:00:00 via TOPICAL

## 2017-06-25 MED ORDER — LACTATED RINGERS IV SOLN
INTRAVENOUS | Status: DC | PRN
  Administered 2017-06-25 (×2): via INTRAVENOUS

## 2017-06-25 MED ORDER — VANCOMYCIN HCL 1000 MG IV SOLR
INTRAVENOUS | Status: DC | PRN
  Administered 2017-06-25: 1000 mg via TOPICAL

## 2017-06-25 MED ORDER — ONDANSETRON HCL 4 MG/2ML IJ SOLN
INTRAMUSCULAR | Status: DC | PRN
  Administered 2017-06-25: 4 mg via INTRAVENOUS

## 2017-06-25 MED ORDER — SODIUM CHLORIDE 0.9 % IR SOLN
Status: DC | PRN
  Administered 2017-06-25: 09:00:00

## 2017-06-25 MED ORDER — 0.9 % SODIUM CHLORIDE (POUR BTL) OPTIME
TOPICAL | Status: DC | PRN
  Administered 2017-06-25: 1000 mL

## 2017-06-25 MED ORDER — DOCUSATE SODIUM 100 MG PO CAPS
100.0000 mg | ORAL_CAPSULE | Freq: Two times a day (BID) | ORAL | Status: DC
  Administered 2017-06-25 – 2017-06-28 (×6): 100 mg via ORAL
  Filled 2017-06-25 (×7): qty 1

## 2017-06-25 MED ORDER — MORPHINE SULFATE (PF) 4 MG/ML IV SOLN
4.0000 mg | INTRAVENOUS | Status: DC | PRN
  Administered 2017-06-25 – 2017-06-26 (×4): 4 mg via INTRAVENOUS
  Filled 2017-06-25: qty 1

## 2017-06-25 MED ORDER — BUPIVACAINE LIPOSOME 1.3 % IJ SUSP
20.0000 mL | Freq: Once | INTRAMUSCULAR | Status: DC
  Filled 2017-06-25: qty 20

## 2017-06-25 MED ORDER — SUGAMMADEX SODIUM 200 MG/2ML IV SOLN
INTRAVENOUS | Status: DC | PRN
  Administered 2017-06-25: 200 mg via INTRAVENOUS

## 2017-06-25 SURGICAL SUPPLY — 66 items
BAG DECANTER FOR FLEXI CONT (MISCELLANEOUS) ×3 IMPLANT
BASKET BONE COLLECTION (BASKET) ×3 IMPLANT
BENZOIN TINCTURE PRP APPL 2/3 (GAUZE/BANDAGES/DRESSINGS) ×3 IMPLANT
BLADE CLIPPER SURG (BLADE) IMPLANT
BUR MATCHSTICK NEURO 3.0 LAGG (BURR) ×3 IMPLANT
BUR PRECISION FLUTE 6.0 (BURR) ×3 IMPLANT
CANISTER SUCT 3000ML PPV (MISCELLANEOUS) ×3 IMPLANT
CAP REVERE LOCKING (Cap) ×12 IMPLANT
CARTRIDGE OIL MAESTRO DRILL (MISCELLANEOUS) ×1 IMPLANT
CLOSURE WOUND 1/2 X4 (GAUZE/BANDAGES/DRESSINGS) ×1
CONT SPEC 4OZ CLIKSEAL STRL BL (MISCELLANEOUS) ×3 IMPLANT
COVER BACK TABLE 60X90IN (DRAPES) ×6 IMPLANT
DIFFUSER DRILL AIR PNEUMATIC (MISCELLANEOUS) ×3 IMPLANT
DRAPE C-ARM 42X72 X-RAY (DRAPES) ×6 IMPLANT
DRAPE HALF SHEET 40X57 (DRAPES) ×3 IMPLANT
DRAPE LAPAROTOMY 100X72X124 (DRAPES) ×3 IMPLANT
DRAPE POUCH INSTRU U-SHP 10X18 (DRAPES) ×3 IMPLANT
DRAPE SURG 17X23 STRL (DRAPES) ×12 IMPLANT
ELECT BLADE 4.0 EZ CLEAN MEGAD (MISCELLANEOUS) ×3
ELECT REM PT RETURN 9FT ADLT (ELECTROSURGICAL) ×3
ELECTRODE BLDE 4.0 EZ CLN MEGD (MISCELLANEOUS) ×1 IMPLANT
ELECTRODE REM PT RTRN 9FT ADLT (ELECTROSURGICAL) ×1 IMPLANT
EVACUATOR 1/8 PVC DRAIN (DRAIN) IMPLANT
GAUZE SPONGE 4X4 12PLY STRL (GAUZE/BANDAGES/DRESSINGS) ×3 IMPLANT
GAUZE SPONGE 4X4 16PLY XRAY LF (GAUZE/BANDAGES/DRESSINGS) ×3 IMPLANT
GLOVE BIO SURGEON STRL SZ8 (GLOVE) ×6 IMPLANT
GLOVE BIO SURGEON STRL SZ8.5 (GLOVE) ×6 IMPLANT
GLOVE BIOGEL PI IND STRL 7.0 (GLOVE) ×2 IMPLANT
GLOVE BIOGEL PI IND STRL 7.5 (GLOVE) ×2 IMPLANT
GLOVE BIOGEL PI IND STRL 8 (GLOVE) ×2 IMPLANT
GLOVE BIOGEL PI INDICATOR 7.0 (GLOVE) ×4
GLOVE BIOGEL PI INDICATOR 7.5 (GLOVE) ×4
GLOVE BIOGEL PI INDICATOR 8 (GLOVE) ×4
GOWN STRL REUS W/ TWL LRG LVL3 (GOWN DISPOSABLE) ×2 IMPLANT
GOWN STRL REUS W/ TWL XL LVL3 (GOWN DISPOSABLE) ×4 IMPLANT
GOWN STRL REUS W/TWL 2XL LVL3 (GOWN DISPOSABLE) IMPLANT
GOWN STRL REUS W/TWL LRG LVL3 (GOWN DISPOSABLE) ×4
GOWN STRL REUS W/TWL XL LVL3 (GOWN DISPOSABLE) ×8
HEMOSTAT POWDER KIT SURGIFOAM (HEMOSTASIS) ×3 IMPLANT
KIT BASIN OR (CUSTOM PROCEDURE TRAY) ×3 IMPLANT
KIT ROOM TURNOVER OR (KITS) ×3 IMPLANT
NEEDLE HYPO 21X1.5 SAFETY (NEEDLE) IMPLANT
NEEDLE HYPO 22GX1.5 SAFETY (NEEDLE) ×3 IMPLANT
NS IRRIG 1000ML POUR BTL (IV SOLUTION) ×3 IMPLANT
OIL CARTRIDGE MAESTRO DRILL (MISCELLANEOUS) ×3
PACK LAMINECTOMY NEURO (CUSTOM PROCEDURE TRAY) ×3 IMPLANT
PAD ARMBOARD 7.5X6 YLW CONV (MISCELLANEOUS) ×9 IMPLANT
PATTIES SURGICAL .5 X1 (DISPOSABLE) IMPLANT
ROD REVERE 6.35 45MM (Rod) ×6 IMPLANT
SCREW REVERE 6.35 6.5MMX45 (Screw) ×6 IMPLANT
SCREW REVERE 6.5X50MM (Screw) ×6 IMPLANT
SPACER ALTERA 10X31 8-12MM-8 (Spacer) ×3 IMPLANT
SPONGE LAP 4X18 X RAY DECT (DISPOSABLE) IMPLANT
SPONGE NEURO XRAY DETECT 1X3 (DISPOSABLE) IMPLANT
SPONGE SURGIFOAM ABS GEL 100 (HEMOSTASIS) ×3 IMPLANT
STRIP BIOACTIVE 20CC 25X100X8 (Miscellaneous) ×3 IMPLANT
STRIP CLOSURE SKIN 1/2X4 (GAUZE/BANDAGES/DRESSINGS) ×2 IMPLANT
SUT PROLENE 6 0 BV (SUTURE) ×3 IMPLANT
SUT VIC AB 1 CT1 18XBRD ANBCTR (SUTURE) ×2 IMPLANT
SUT VIC AB 1 CT1 8-18 (SUTURE) ×4
SUT VIC AB 2-0 CP2 18 (SUTURE) ×6 IMPLANT
TAPE CLOTH SURG 4X10 WHT LF (GAUZE/BANDAGES/DRESSINGS) ×3 IMPLANT
TOWEL GREEN STERILE (TOWEL DISPOSABLE) ×3 IMPLANT
TOWEL GREEN STERILE FF (TOWEL DISPOSABLE) ×3 IMPLANT
TRAY FOLEY W/METER SILVER 16FR (SET/KITS/TRAYS/PACK) ×3 IMPLANT
WATER STERILE IRR 1000ML POUR (IV SOLUTION) ×3 IMPLANT

## 2017-06-25 NOTE — Anesthesia Procedure Notes (Signed)
Procedure Name: Intubation Date/Time: 06/25/2017 7:45 AM Performed by: Rejeana Brock L Pre-anesthesia Checklist: Patient identified, Emergency Drugs available, Suction available and Patient being monitored Patient Re-evaluated:Patient Re-evaluated prior to induction Oxygen Delivery Method: Circle System Utilized Preoxygenation: Pre-oxygenation with 100% oxygen Induction Type: IV induction and Cricoid Pressure applied Ventilation: Mask ventilation without difficulty Laryngoscope Size: Mac and 3 Grade View: Grade II Tube type: Oral Tube size: 7.0 mm Number of attempts: 1 Airway Equipment and Method: Stylet and Oral airway Placement Confirmation: ETT inserted through vocal cords under direct vision,  positive ETCO2 and breath sounds checked- equal and bilateral Secured at: 21 cm Tube secured with: Tape Dental Injury: Teeth and Oropharynx as per pre-operative assessment

## 2017-06-25 NOTE — Transfer of Care (Signed)
Immediate Anesthesia Transfer of Care Note  Patient: Faith Scott  Procedure(s) Performed: Procedure(s) with comments: DECOMPRESSION LAMINECTOMY LUMBAR THREE- LUMBAR FOUR, INSTRUMENTATION WITH PEDICLE SCREWS AND ROD PLACEMENT, INTERBODY PROSTHESIS, POSTERIOR LATERAL ARTHRODESIS (N/A) - DECOMPRESSION LAMINECTOMY LUMBAR 3- LUMBAR 4, INSTRUMENTATION WITH PEDICLE SCREWS AND ROD PLACEMENT, INTERBODY PROSTHESIS, POSTERIOR LATERAL ARTHRODESIS  Patient Location: PACU  Anesthesia Type:General  Level of Consciousness: awake and alert   Airway & Oxygen Therapy: Patient Spontanous Breathing and Patient connected to nasal cannula oxygen  Post-op Assessment: Report given to RN and Post -op Vital signs reviewed and stable, MAE x 4  Post vital signs: Reviewed and stable  Last Vitals:  Vitals:   06/25/17 0622  BP: 126/84  Pulse: 60  Resp: 20  Temp: 36.6 C  SpO2: 96%    Last Pain:  Vitals:   06/25/17 0622  TempSrc: Oral         Complications: No apparent anesthesia complications

## 2017-06-25 NOTE — Anesthesia Preprocedure Evaluation (Signed)
Anesthesia Evaluation  Patient identified by MRN, date of birth, ID band Patient awake    Reviewed: Allergy & Precautions  Airway Mallampati: II  TM Distance: >3 FB     Dental   Pulmonary shortness of breath, sleep apnea , pneumonia, former smoker,    breath sounds clear to auscultation       Cardiovascular hypertension,  Rhythm:Regular Rate:Normal     Neuro/Psych    GI/Hepatic GERD  ,(+) Hepatitis -  Endo/Other  Hypothyroidism   Renal/GU Renal disease     Musculoskeletal   Abdominal   Peds  Hematology   Anesthesia Other Findings   Reproductive/Obstetrics                             Anesthesia Physical Anesthesia Plan  ASA: III  Anesthesia Plan: General   Post-op Pain Management:    Induction: Intravenous  PONV Risk Score and Plan: 3 and Ondansetron, Dexamethasone, Midazolam and Propofol infusion  Airway Management Planned: Oral ETT  Additional Equipment:   Intra-op Plan:   Post-operative Plan: Possible Post-op intubation/ventilation  Informed Consent: I have reviewed the patients History and Physical, chart, labs and discussed the procedure including the risks, benefits and alternatives for the proposed anesthesia with the patient or authorized representative who has indicated his/her understanding and acceptance.   Dental advisory given  Plan Discussed with: CRNA and Anesthesiologist  Anesthesia Plan Comments:         Anesthesia Quick Evaluation

## 2017-06-25 NOTE — Anesthesia Postprocedure Evaluation (Signed)
Anesthesia Post Note  Patient: Faith Scott  Procedure(s) Performed: Procedure(s) (LRB): DECOMPRESSION LAMINECTOMY LUMBAR THREE- LUMBAR FOUR, INSTRUMENTATION WITH PEDICLE SCREWS AND ROD PLACEMENT, INTERBODY PROSTHESIS, POSTERIOR LATERAL ARTHRODESIS (N/A)     Patient location during evaluation: PACU Anesthesia Type: General Level of consciousness: awake Pain management: pain level controlled Vital Signs Assessment: post-procedure vital signs reviewed and stable Respiratory status: spontaneous breathing Cardiovascular status: stable Postop Assessment: no apparent nausea or vomiting Anesthetic complications: no    Last Vitals:  Vitals:   06/25/17 1230 06/25/17 1245  BP: 119/74   Pulse: 77 75  Resp: 19 11  Temp:    SpO2: 98% 97%    Last Pain:  Vitals:   06/25/17 0622  TempSrc: Oral                 Vitaliy Eisenhour

## 2017-06-25 NOTE — Op Note (Signed)
Brief history: The patient is a 76 year old white female whose has some chronic back and knee pain. She's had knee surgery. Her pain persisted. 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 I discussed the various treatment options with the patient and her husband including surgery. She has weighed the risks, benefits, and alternative surgery and decided proceed with an L3-4 decompression, instrumentation and fusion.  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 Etc. requiring a wide decompression of the nerve roots.); L3-4 transforaminal lumbar interbody fusion with local morselized autograft bone and Kinnex graft extender; insertion of interbody prosthesis at L3-4 (globus peek expandable interbody prosthesis); posterior nonsegmental instrumentation from L3 to L4 with globus titanium pedicle screws and rods; posterior lateral arthrodesis at L3-4 with local morselized autograft bone and Kinnex bone graft extender.  Surgeon: Dr. Delma Officer  Asst.: Dr. Wynetta Emery  Anesthesia: Gen. endotracheal  Estimated blood loss: 300 mL  Drains: None  Complications: None  Description of procedure: The patient was brought to the operating room by the anesthesia team. General endotracheal anesthesia was induced. The patient was turned to the prone position on the Wilson frame. The patient's lumbosacral region was then prepared with Betadine scrub and Betadine solution. Sterile drapes were applied.  I then injected the area to be  incised with Marcaine with epinephrine solution. I then used the scalpel to make a linear midline incision over the L3-4 interspace. I then used electrocautery to perform a bilateral subperiosteal dissection exposing the spinous process and lamina of L3 and L4. We then obtained intraoperative radiograph to confirm our location. We then inserted the Verstrac retractor to provide exposure.  I began the decompression by using the high speed drill to perform laminotomies at L3-4 bilaterally. We then used the Kerrison punches to widen the laminotomy and removed the ligamentum flavum at L3-4 bilaterally. We used the Kerrison punches to remove the medial facets at L3-4 bilaterally. We removed the inferior facets at L3-4 on the right We performed wide foraminotomies about the bilateral L3 and L4 nerve roots completing the decompression.  We now turned our attention to the posterior lumbar interbody fusion. I used a scalpel to incise the intervertebral disc at L3-4 bilaterally. I then performed a partial intervertebral discectomy at L3-4 bilaterally using the pituitary forceps. We prepared the vertebral endplates at L3-4 bilaterally for the fusion by removing the soft tissues with the curettes. We then used the trial spacers to pick the appropriate sized interbody prosthesis. We prefilled his prosthesis with a combination of local morselized autograft bone that we obtained during the decompression as well as Kinnex bone graft extender. We inserted the prefilled prosthesis into the interspace at L3-4, we then expanded the prosthesis. There was a good snug fit of the prosthesis in the interspace. We then filled and the remainder of the intervertebral disc space with local morselized autograft bone and Kinnex. This completed the posterior lumbar interbody arthrodesis.  We now turned attention to the instrumentation. Under fluoroscopic guidance we cannulated the bilateral L3 and L4 pedicles with the bone probe. We then  removed the bone probe. We then tapped  the pedicle with a 5.5 millimeter tap. We then removed the tap. We probed inside the tapped pedicle with a ball probe to rule out cortical breaches. We then inserted a 6.5 x 45 and 50 millimeter pedicle screw into the L3 and L4 pedicles bilaterally under fluoroscopic guidance. We then palpated along the medial aspect of the pedicles to rule out cortical breaches. There were none. The nerve roots were not injured. We then connected the unilateral pedicle screws with a lordotic rod. We compressed the construct and secured the rod in place with the caps. We then tightened the caps appropriately. This completed the instrumentation from L3-4 bilaterally.  We now turned our attention to the posterior lateral arthrodesis at L3-4 bilaterally. We used the high-speed drill to decorticate the remainder of the facets, pars, transverse process at L3-4 bilaterally. We then applied a combination of local morselized autograft bone and Kinnex bone graft extender over these decorticated posterior lateral structures. This completed the posterior lateral arthrodesis.  We then obtained hemostasis using bipolar electrocautery. We irrigated the wound out with bacitracin solution. We inspected the thecal sac and nerve roots and noted they were well decompressed. We then removed the retractor. We placed vancomycin powder in the wound. We reapproximated patient's thoracolumbar fascia with interrupted #1 Vicryl suture. We reapproximated patient's subcutaneous tissue with interrupted 2-0 Vicryl suture. The reapproximated patient's skin with Steri-Strips and benzoin. The wound was then coated with bacitracin ointment. A sterile dressing was applied. The drapes were removed. The patient was subsequently returned to the supine position where they were extubated by the anesthesia team. He was then transported to the post anesthesia care unit in stable condition. All sponge instrument and needle counts  were reportedly correct at the end of this case.

## 2017-06-25 NOTE — Progress Notes (Signed)
Patient ID: Faith Scott, female   DOB: Jan 08, 1941, 76 y.o.   MRN: [id number] Subjective:  The patient is alert and pleasant. Her back is appropriately sore.  Objective: Vital signs in last 24 hours: Temp:  [97.9 F (36.6 C)] 97.9 F (36.6 C) (09/27 0622) Pulse Rate:  [60] 60 (09/27 0622) Resp:  [20] 20 (09/27 0622) BP: (126)/(84) 126/84 (09/27 0622) SpO2:  [96 %] 96 % (09/27 0622)  Intake/Output from previous day: No intake/output data recorded. Intake/Output this shift: Total I/O In: 1850 [I.V.:1600; IV Piggyback:250] Out: 550 [Urine:200; Blood:350]  Physical exam the patient is alert and pleasant. She is moving her lower extremities well.  Lab Results:  Recent Labs  06/23/17 1438  WBC 7.9  HGB 15.2*  HCT 42.6  PLT 228   BMET  Recent Labs  06/23/17 1438  NA 136  K 3.3*  CL 103  CO2 25  GLUCOSE 93  BUN 14  CREATININE 0.82  CALCIUM 9.0    Studies/Results: Dg Lumbar Spine 2-3 Views  Result Date: 06/25/2017 CLINICAL DATA:  L3-4 PLIF EXAM: DG C-ARM 61-120 MIN; LUMBAR SPINE - 2-3 VIEW COMPARISON:  Film from earlier in the same day. FLUOROSCOPY TIME:  Fluoroscopy Time:  39 seconds Radiation Exposure Index (if provided by the fluoroscopic device): Not available Number of Acquired Spot Images: 2 FINDINGS: Interbody fusion is noted at L3-4 with pedicle placement bilaterally. Posterior fixation device is not present at this time. IMPRESSION: Status post L3-4 PLIF Electronically Signed   By: Alcide Clever M.D.   On: 06/25/2017 11:36   Dg Lumbar Spine 1 View  Result Date: 06/25/2017 CLINICAL DATA:  Intraoperative film for L3-4 posterior lumbar fusion EXAM: LUMBAR SPINE - 1 VIEW COMPARISON:  Lumbar spine films of 01/13/2017 and MR lumbar spine of 12/17/2016 FINDINGS: On the cross-table portable view obtained, there is and instrument beneath the spinous process of L3 directed toward the L3-4 interspace for localization P degenerative disc disease is noted diffusely throughout  the lumbar spine, with slight anterolisthesis again present at L3-4. IMPRESSION: 1. Instrument posteriorly positioned directed toward L3-4 interspace for localization. 2. No change in anterolisthesis of L3 on L4 with diffuse degenerative disc disease throughout the lumbar spine. Electronically Signed   By: Dwyane Dee M.D.   On: 06/25/2017 09:00   Dg C-arm 1-60 Min  Result Date: 06/25/2017 CLINICAL DATA:  L3-4 PLIF EXAM: DG C-ARM 61-120 MIN; LUMBAR SPINE - 2-3 VIEW COMPARISON:  Film from earlier in the same day. FLUOROSCOPY TIME:  Fluoroscopy Time:  39 seconds Radiation Exposure Index (if provided by the fluoroscopic device): Not available Number of Acquired Spot Images: 2 FINDINGS: Interbody fusion is noted at L3-4 with pedicle placement bilaterally. Posterior fixation device is not present at this time. IMPRESSION: Status post L3-4 PLIF Electronically Signed   By: Alcide Clever M.D.   On: 06/25/2017 11:36    Assessment/Plan: The patient is doing well. I spoke with her husband.  LOS: 0 days     Niclas Markell D 06/25/2017, 12:15 PM

## 2017-06-25 NOTE — H&P (Signed)
Subjective: He patient is a 76 year old white female who has complained of back and leg pain consistent with a lumbar radiculopathy. who she has failed medical management and was worked up with a lumbar MRI and lumbar x-rays. This demonstrated an L3-4 spondylolisthesis with spinal stenosis. I discussed the various treatment options. She has decided to proceed with surgery.   Past Medical History:  Diagnosis Date  . Anxiety   . Chronic kidney disease    Stage 2 kidney disease-  RESOLVED   . Depression   . GERD (gastroesophageal reflux disease)   . Hepatitis    age 32; pt unsure of what kind  . Hypercholesteremia   . Hypertension   . Hypothyroid    resolved as of 03/07/15 no medications anymore  . Insufficiency fracture of medial femoral condyle (HCC) 06/26/2015  . Mitral valve prolapse   . Pneumonia    hx of  . Primary localized osteoarthritis of right knee   . Shortness of breath dyspnea    with slight exertion  . Sinusitis   . Sleep apnea    Did have sleep apnea, does not have it since having UVULOPALATOPHARYNGOPLASTY   . Urgency of urination    frequency of urination as well    Past Surgical History:  Procedure Laterality Date  . COLONOSCOPY    . EYE SURGERY Bilateral 2013   cataracts   . KNEE ARTHROSCOPY W/ MENISCECTOMY Right   . SHOULDER SURGERY Left   . SHOULDER SURGERY Right   . TOE SURGERY Bilateral    Had toes straightened and bunions removed  . TONSILLECTOMY    . 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 TUBAL LIGATION    . UVULOPALATOPHARYNGOPLASTY     no CPAP needed now    Allergies  Allergen Reactions  . Metoprolol     FATIGUE  . Calan [Verapamil] Other (See Comments)    Hair loss  . Lisinopril Cough    Social History  Substance Use Topics  . Smoking status:  Former Games developer  . Smokeless tobacco: Not on file  . Alcohol use 0.0 oz/week     Comment: 2 times a month; socially    Family History  Problem Relation Age of Onset  . Hypertension Mother   . Stroke Mother   . Clotting disorder Mother        mother had a stoke postopertively  . Clotting disorder Sister        sister died at age 16 of a postoperative blood clot   Prior to Admission medications   Medication Sig Start Date End Date Taking? Authorizing Provider  amLODipine (NORVASC) 5 MG tablet Take 5 mg by mouth daily.   Yes [provider]  BIOTIN PO Take 1 tablet by mouth daily.   Yes [provider]  CALCIUM PO Take 600 mg by mouth daily.    Yes [provider]  losartan-hydrochlorothiazide (HYZAAR) 100-25 MG per tablet Take 0.5 tablets by mouth daily.  10/13/13  Yes [provider]  MAGNESIUM PO Take 250 mg by mouth daily.    Yes [provider]  NON FORMULARY Apply 1 application topically 2 (two) times daily. Estriol 0.3% / DMAE 5% / Hyaluronate 60 gm   Yes [provider]  Nutritional Supplements (DHEA PO) Take 5 mg by mouth daily.  Yes [provider]  oxybutynin (DITROPAN) 5 MG tablet Take 5 mg by mouth daily.    Yes [provider]  psyllium (REGULOID) 0.52 g capsule Take 0.52 g by mouth 2 (two) times daily.   Yes [provider]  ranitidine (ZANTAC) 300 MG tablet Take 300 mg by mouth daily.   Yes [provider]  Tetrahydrozoline HCl (VISINE OP) Place 1-2 drops into both eyes as needed (for dry eyes).   Yes [provider]  vitamin B-12 (CYANOCOBALAMIN) 1000 MCG tablet Take 1,000 mcg by mouth daily.   Yes [provider]  methocarbamol (ROBAXIN) 500 MG tablet Take 1 tablet (500 mg total) by mouth 4 (four) times daily. Patient not taking: Reported on 06/19/2017 09/19/15   Renne Crigler, PA-C     Review of Systems  Positive ROS: As above  All other systems have been  reviewed and were otherwise negative with the exception of those mentioned in the HPI and as above.  Objective: Vital signs in last 24 hours: Temp:  [97.9 F (36.6 C)] 97.9 F (36.6 C) (09/27 0622) Pulse Rate:  [60] 60 (09/27 0622) Resp:  [20] 20 (09/27 0622) BP: (126)/(84) 126/84 (09/27 0622) SpO2:  [96 %] 96 % (09/27 0622)  General Appearance: Alert Head: Normocephalic, without obvious abnormality, atraumatic Eyes: PERRL, conjunctiva/corneas clear, EOM's intact,    Ears: Normal  Throat: Normal  Neck: Supple, Back: unremarkable Lungs: Clear to auscultation bilaterally, respirations unlabored Heart: Regular rate and rhythm, no murmur, rub or gallop Abdomen: Soft, non-tender Extremities: Extremities normal, atraumatic, no cyanosis or edema Skin: unremarkable  NEUROLOGIC:   Mental status: alert and oriented,Motor Exam - grossly normal Sensory Exam - grossly normal Reflexes:  Coordination - grossly normal Gait - grossly normal Balance - grossly normal Cranial Nerves: I: smell Not tested  II: visual acuity  OS: Normal  OD: Normal   II: visual fields Full to confrontation  II: pupils Equal, round, reactive to light  III,VII: ptosis None  III,IV,VI: extraocular muscles  Full ROM  V: mastication Normal  V: facial light touch sensation  Normal  V,VII: corneal reflex  Present  VII: facial muscle function - upper  Normal  VII: facial muscle function - lower Normal  VIII: hearing Not tested  IX: soft palate elevation  Normal  IX,X: gag reflex Present  XI: trapezius strength  5/5  XI: sternocleidomastoid strength 5/5  XI: neck flexion strength  5/5  XII: tongue strength  Normal    Data Review Lab Results  Component Value Date   WBC 7.9 06/23/2017   HGB 15.2 (H) 06/23/2017   HCT 42.6 06/23/2017   MCV 84.2 06/23/2017   PLT 228 06/23/2017   Lab Results  Component Value Date   NA 136 06/23/2017   K 3.3 (L) 06/23/2017   CL 103 06/23/2017   CO2 25 06/23/2017   BUN  14 06/23/2017   CREATININE 0.82 06/23/2017   GLUCOSE 93 06/23/2017   Lab Results  Component Value Date   INR 0.99 06/15/2015    Assessment/Plan: L3-4 spondylolisthesis, spinal stenosis, lumbago, lumbar radiculopathy: I have discussed the situation with the patient. I have reviewed her imaging studies with her. We have discussed the various treatment options including surgery. I have described the surgical treatment option of an L3-4 decompression, instrumentation, and fusion. I have given her a surgical pamphlet. I have shown her surgical models. We have discussed the risks, benefits, alternatives, expected postoperative course, and likelihood of achieving our goals with  surgery. I have answered all her questions. She has decided to proceed with surgery.  Faith Scott 06/25/2017 7:29 AM

## 2017-06-26 ENCOUNTER — Encounter (HOSPITAL_COMMUNITY): Payer: Self-pay | Admitting: General Practice

## 2017-06-26 LAB — BASIC METABOLIC PANEL
ANION GAP: 8 (ref 5–15)
BUN: 10 mg/dL (ref 6–20)
CALCIUM: 8.3 mg/dL — AB (ref 8.9–10.3)
CO2: 27 mmol/L (ref 22–32)
CREATININE: 0.73 mg/dL (ref 0.44–1.00)
Chloride: 102 mmol/L (ref 101–111)
GLUCOSE: 125 mg/dL — AB (ref 65–99)
Potassium: 3.8 mmol/L (ref 3.5–5.1)
Sodium: 137 mmol/L (ref 135–145)

## 2017-06-26 LAB — CBC
HCT: 36.4 % (ref 36.0–46.0)
Hemoglobin: 12.4 g/dL (ref 12.0–15.0)
MCH: 29 pg (ref 26.0–34.0)
MCHC: 34.1 g/dL (ref 30.0–36.0)
MCV: 85.2 fL (ref 78.0–100.0)
PLATELETS: 196 10*3/uL (ref 150–400)
RBC: 4.27 MIL/uL (ref 3.87–5.11)
RDW: 13.8 % (ref 11.5–15.5)
WBC: 10.3 10*3/uL (ref 4.0–10.5)

## 2017-06-26 MED ORDER — SODIUM CHLORIDE 0.9% FLUSH
3.0000 mL | Freq: Two times a day (BID) | INTRAVENOUS | Status: DC
  Administered 2017-06-26 – 2017-06-28 (×4): 3 mL via INTRAVENOUS

## 2017-06-26 MED ORDER — SODIUM CHLORIDE 0.9 % IV SOLN
250.0000 mL | INTRAVENOUS | Status: DC
  Administered 2017-06-26: 250 mL via INTRAVENOUS

## 2017-06-26 MED ORDER — HYDROCHLOROTHIAZIDE 12.5 MG PO CAPS
12.5000 mg | ORAL_CAPSULE | Freq: Every day | ORAL | Status: DC
  Administered 2017-06-26 – 2017-06-28 (×3): 12.5 mg via ORAL
  Filled 2017-06-26 (×3): qty 1

## 2017-06-26 MED ORDER — TETRAHYDROZOLINE HCL 0.05 % OP SOLN
1.0000 [drp] | Freq: Every day | OPHTHALMIC | Status: DC | PRN
  Filled 2017-06-26: qty 15

## 2017-06-26 MED ORDER — SODIUM CHLORIDE 0.9% FLUSH: 3.0000 mL | INTRAVENOUS | Status: DC | PRN

## 2017-06-26 MED ORDER — FAMOTIDINE 20 MG PO TABS
20.0000 mg | ORAL_TABLET | Freq: Two times a day (BID) | ORAL | Status: DC
  Administered 2017-06-27 – 2017-06-28 (×3): 20 mg via ORAL
  Filled 2017-06-26 (×5): qty 1

## 2017-06-26 MED ORDER — AMLODIPINE BESYLATE 5 MG PO TABS
5.0000 mg | ORAL_TABLET | Freq: Every day | ORAL | Status: DC
  Administered 2017-06-26 – 2017-06-28 (×3): 5 mg via ORAL
  Filled 2017-06-26 (×3): qty 1

## 2017-06-26 MED ORDER — LOSARTAN POTASSIUM 50 MG PO TABS
50.0000 mg | ORAL_TABLET | Freq: Every day | ORAL | Status: DC
  Administered 2017-06-26 – 2017-06-28 (×3): 50 mg via ORAL
  Filled 2017-06-26 (×3): qty 1

## 2017-06-26 MED ORDER — LOSARTAN POTASSIUM-HCTZ 100-25 MG PO TABS: 0.5000 | ORAL_TABLET | Freq: Every day | ORAL | Status: DC

## 2017-06-26 MED ORDER — MAGNESIUM OXIDE 400 (241.3 MG) MG PO TABS
200.0000 mg | ORAL_TABLET | Freq: Every day | ORAL | Status: DC
Start: 2017-06-26 — End: 2017-06-28
  Administered 2017-06-26 – 2017-06-28 (×3): 200 mg via ORAL
  Filled 2017-06-26 (×3): qty 1

## 2017-06-26 MED ORDER — OXYBUTYNIN CHLORIDE 5 MG PO TABS
5.0000 mg | ORAL_TABLET | Freq: Every day | ORAL | Status: DC
Start: 2017-06-26 — End: 2017-06-28
  Administered 2017-06-26 – 2017-06-28 (×3): 5 mg via ORAL
  Filled 2017-06-26 (×3): qty 1

## 2017-06-26 NOTE — Care Management Note (Signed)
Case Management Note  Patient Details  Name: Faith Scott MRN: [id number] Date of Birth: 04/03/1941  Subjective/Objective:  Pt underwent: DECOMPRESSION LAMINECTOMY LUMBAR THREE- LUMBAR FOUR, INSTRUMENTATION WITH PEDICLE SCREWS AND ROD PLACEMENT, INTERBODY PROSTHESIS, POSTERIOR LATERAL ARTHRODESIS. She is from home with her spouse.                  Action/Plan: No f/u per PT. Awaiting OT eval. CM following for d/c needs, physician orders.   Expected Discharge Date:                  Expected Discharge Plan:     In-House Referral:     Discharge planning Services     Post Acute Care Choice:    Choice offered to:     DME Arranged:    DME Agency:     HH Arranged:    HH Agency:     Status of Service:  In process, will continue to follow  If discussed at Long Length of Stay Meetings, dates discussed:    Additional Comments:  Kermit Balo, RN 06/26/2017, 2:01 PM

## 2017-06-26 NOTE — Evaluation (Signed)
Physical Therapy Evaluation Patient Details Name: Faith Scott MRN: [id number] DOB: [birth year] Today's Date: 06/26/2017   History of Present Illness  Pt is a 76 y/o female s/p decompressive laminectomies at L3-L4 and TLIF L3-L4. PMH including but not limited to HTN and R TKA in 2016.  Clinical Impression  Pt presented sitting OOB in recliner chair, awake and willing to participate in therapy session. Prior to admission, pt reported that she ambulated with [facility] PRN when outside of her home and was independent with ADLs. PT reviewed 3/3 back precautions and log roll technique with pt. Pt ambulated in hallway with supervision for safety and use of RW. PT will continue to follow pt acutely to ensure a safe d/c home and for mobility progression. Plan for stair training next session.     Follow Up Recommendations No PT follow up    Equipment Recommendations  None recommended by PT    Recommendations for Other Services       Precautions / Restrictions Precautions Precautions: Fall;Back Precaution Comments: PT reviewed 3/3 back precautions with pt and log roll technique Required Braces or Orthoses: Spinal Brace Spinal Brace: Lumbar corset;Applied in sitting position Restrictions Weight Bearing Restrictions: No      Mobility  Bed Mobility Overal bed mobility: Needs Assistance Bed Mobility: Rolling;Sit to Sidelying Rolling: Supervision       Sit to sidelying: Min guard General bed mobility comments: increased time and effort, no bed rail, min guard for safety and cueing for log roll technique  Transfers Overall transfer level: Needs assistance Equipment used: Rolling walker (2 wheeled) Transfers: Sit to/from Stand Sit to Stand: Supervision         General transfer comment: supervision for safety  Ambulation/Gait Ambulation/Gait assistance: Supervision Ambulation Distance (Feet): 150 Feet Assistive device: Rolling walker (2 wheeled) Gait Pattern/deviations: Step-through  pattern;Decreased stride length Gait velocity: decreased Gait velocity interpretation: Below normal speed for age/gender General Gait Details: slow, steady gait with no instability or LOB, supervision for safety  Stairs            Wheelchair Mobility    Modified Rankin (Stroke Patients Only)       Balance Overall balance assessment: Needs assistance Sitting-balance support: Feet supported Sitting balance-Leahy Scale: Good     Standing balance support: During functional activity;No upper extremity supported Standing balance-Leahy Scale: Fair                               Pertinent Vitals/Pain Pain Assessment: 0-10 Pain Score: 5  Pain Location: back, R hip Pain Descriptors / Indicators: Sore Pain Intervention(s): Monitored during session;Repositioned    Home Living Family/patient expects to be discharged to:: Private residence Living Arrangements: Spouse/significant other Available Help at Discharge: Family;Available 24 hours/day Type of Home: House Home Access: Stairs to enter Entrance Stairs-Rails: Left Entrance Stairs-Number of Steps: 3 Home Layout: Two level;Able to live on main level with bedroom/bathroom Home Equipment: Dan Humphreys - 2 wheels;Cane - single point;Bedside commode      Prior Function Level of Independence: Independent with assistive device(s)         Comments: pt reported that she ambulated with use of SPC PRN     Hand Dominance        Extremity/Trunk Assessment   Upper Extremity Assessment Upper Extremity Assessment: Overall WFL for tasks assessed    Lower Extremity Assessment Lower Extremity Assessment: Overall WFL for tasks assessed    Cervical / Trunk Assessment  Cervical / Trunk Assessment: Other exceptions Cervical / Trunk Exceptions: s/p lumbar sx  Communication   Communication: No difficulties  Cognition Arousal/Alertness: Awake/alert Behavior During Therapy: WFL for tasks assessed/performed Overall  Cognitive Status: Within Functional Limits for tasks assessed                                        General Comments      Exercises     Assessment/Plan    PT Assessment Patient needs continued PT services  PT Problem List Decreased balance;Decreased mobility;Decreased coordination;Decreased knowledge of use of DME;Decreased safety awareness;Decreased knowledge of precautions;Pain       PT Treatment Interventions DME instruction;Gait training;Stair training;Functional mobility training;Therapeutic exercise;Therapeutic activities;Balance training;Neuromuscular re-education;Patient/family education    PT Goals (Current goals can be found in the Care Plan section)  Acute Rehab PT Goals Patient Stated Goal: return home, decrease pain PT Goal Formulation: With patient Time For Goal Achievement: 07/10/17 Potential to Achieve Goals: Good    Frequency Min 5X/week   Barriers to discharge        Co-evaluation               AM-PAC PT "6 Clicks" Daily Activity  Outcome Measure Difficulty turning over in bed (including adjusting bedclothes, sheets and blankets)?: None Difficulty moving from lying on back to sitting on the side of the bed? : A Little Difficulty sitting down on and standing up from a chair with arms (e.g., wheelchair, bedside commode, etc,.)?: A Little Help needed moving to and from a bed to chair (including a wheelchair)?: A Little Help needed walking in hospital room?: A Little Help needed climbing 3-5 steps with a railing? : A Little 6 Click Score: 19    End of Session Equipment Utilized During Treatment: Gait belt;Back brace Activity Tolerance: Patient tolerated treatment well Patient left: in bed;with bed alarm set;with call bell/phone within reach Nurse Communication: Mobility status PT Visit Diagnosis: Other abnormalities of gait and mobility (R26.89);Pain Pain - Right/Left: Right Pain - part of body: Hip (and back)    Time:  0454-0981 PT Time Calculation (min) (ACUTE ONLY): 24 min   Charges:   PT Evaluation $PT Eval Moderate Complexity: 1 Mod PT Treatments $Gait Training: 8-22 mins   PT G Codes:        Rienzi, PT, DPT [phone]   Alessandra Bevels Mischell Branford 06/26/2017, 9:11 AM

## 2017-06-27 MED ORDER — MAGNESIUM HYDROXIDE 400 MG/5ML PO SUSP
30.0000 mL | Freq: Every day | ORAL | Status: DC | PRN
  Administered 2017-06-27: 30 mL via ORAL
  Filled 2017-06-27: qty 30

## 2017-06-27 MED ORDER — MAGNESIUM HYDROXIDE NICU ORAL SYRINGE 400 MG/5 ML
30.0000 mL | Freq: Every day | ORAL | Status: DC | PRN
  Filled 2017-06-27: qty 30

## 2017-06-27 MED ORDER — DIPHENHYDRAMINE HCL 25 MG PO CAPS
25.0000 mg | ORAL_CAPSULE | ORAL | Status: DC | PRN
  Administered 2017-06-27: 25 mg via ORAL
  Filled 2017-06-27: qty 1

## 2017-06-27 NOTE — Progress Notes (Signed)
Patient still saving surgical site pain and muscle spasms  but able to ambulate with no assistance after set up.

## 2017-06-27 NOTE — Progress Notes (Signed)
Patient complaining of itching from pain medication called on call provider for order for benadryl 25 mg q 4 hr PRN.

## 2017-06-27 NOTE — Progress Notes (Signed)
Pt in bed at this time. Husband at bedside. Pt sat up in a chair and ambulated during shift stand by assist with rolling walker. Brace on and aligned. Surgical dressing site clean, dry and intact. Will continue to monitor.

## 2017-06-27 NOTE — Progress Notes (Signed)
Physical Therapy Treatment Patient Details Name: Faith Scott MRN: [id number] DOB: August 14, 1941 Today's Date: 06/27/2017    History of Present Illness Pt is a 76 y/o female s/p decompressive laminectomies at L3-L4 and TLIF L3-L4. PMH including but not limited to HTN and R TKA in 2016.    PT Comments    Progressing towards goals. Practiced stair navigation and required min guard assist. Pt reporting she would like to practice another time before d/c as she felt a little "wobbly." Current recommendations appropriate. Will continue to follow acutely to maximize functional mobility independence and safety.    Follow Up Recommendations  No PT follow up     Equipment Recommendations  None recommended by PT    Recommendations for Other Services       Precautions / Restrictions Precautions Precautions: Fall;Back Precaution Comments: Pt able to recall 3/3 back precautions.  Required Braces or Orthoses: Spinal Brace Spinal Brace: Lumbar corset;Applied in sitting position Restrictions Weight Bearing Restrictions: No    Mobility  Bed Mobility Overal bed mobility: Needs Assistance Bed Mobility: Rolling;Sit to Sidelying Rolling: Supervision       Sit to sidelying: Supervision General bed mobility comments: increased time and effort secondary to pain. Supervision to ensure log roll technique.   Transfers Overall transfer level: Needs assistance Equipment used: Rolling walker (2 wheeled) Transfers: Sit to/from Stand Sit to Stand: Supervision         General transfer comment: supervision for safety  Ambulation/Gait Ambulation/Gait assistance: Supervision Ambulation Distance (Feet): 150 Feet Assistive device: Rolling walker (2 wheeled) Gait Pattern/deviations: Step-through pattern;Decreased stride length Gait velocity: decreased Gait velocity interpretation: Below normal speed for age/gender General Gait Details: slow, steady gait with no instability or LOB, supervision for  safety   Stairs Stairs: Yes   Stair Management: One rail Left;Step to pattern;Sideways Number of Stairs: 3 General stair comments: Min guard for steadying assist. Educated about using BUE on L rail and performing sideways, step to stair navigation.   Wheelchair Mobility    Modified Rankin (Stroke Patients Only)       Balance Overall balance assessment: Needs assistance Sitting-balance support: Feet supported Sitting balance-Leahy Scale: Good     Standing balance support: No upper extremity supported;Bilateral upper extremity supported;During functional activity Standing balance-Leahy Scale: Fair Standing balance comment: static standing without UE support                             Cognition Arousal/Alertness: Awake/alert Behavior During Therapy: WFL for tasks assessed/performed Overall Cognitive Status: Within Functional Limits for tasks assessed                                        Exercises      General Comments General comments (skin integrity, edema, etc.): Educated about generalized walking program to perform at home.       Pertinent Vitals/Pain Pain Assessment: 0-10 Pain Score: 4  Pain Location: back  Pain Descriptors / Indicators: Sore Pain Intervention(s): Limited activity within patient's tolerance;Monitored during session;Repositioned    Home Living                      Prior Function            PT Goals (current goals can now be found in the care plan section) Acute Rehab PT Goals Patient Stated Goal:  return home, decrease pain PT Goal Formulation: With patient Time For Goal Achievement: 07/10/17 Potential to Achieve Goals: Good Progress towards PT goals: Progressing toward goals    Frequency    Min 5X/week      PT Plan Current plan remains appropriate    Co-evaluation              AM-PAC PT "6 Clicks" Daily Activity  Outcome Measure  Difficulty turning over in bed (including  adjusting bedclothes, sheets and blankets)?: None Difficulty moving from lying on back to sitting on the side of the bed? : A Little Difficulty sitting down on and standing up from a chair with arms (e.g., wheelchair, bedside commode, etc,.)?: Unable Help needed moving to and from a bed to chair (including a wheelchair)?: A Little Help needed walking in hospital room?: A Little Help needed climbing 3-5 steps with a railing? : A Little 6 Click Score: 17    End of Session Equipment Utilized During Treatment: Gait belt;Back brace Activity Tolerance: Patient tolerated treatment well Patient left: in chair;with call bell/phone within reach;with chair alarm set Nurse Communication: Mobility status PT Visit Diagnosis: Other abnormalities of gait and mobility (R26.89);Pain Pain - Right/Left: Right Pain - part of body:  (back )     Time: 1610-9604 PT Time Calculation (min) (ACUTE ONLY): 16 min  Charges:  $Gait Training: 8-22 mins                    G Codes:       Gladys Damme, PT, DPT  Acute Rehabilitation Services  Pager: [phone]    Lehman Prom 06/27/2017, 8:30 AM

## 2017-06-27 NOTE — Progress Notes (Signed)
Vital signs ok Motor function is good but patient experiencing substantial pain which is restricting mobility Dressing removed and incision is clean and dry Tolerating diet voiding but no BM yet Continue supportive care.Laxitives

## 2017-06-28 MED ORDER — OXYCODONE-ACETAMINOPHEN 7.5-325 MG PO TABS: 1.0000 | ORAL_TABLET | ORAL | 0 refills | Status: AC | PRN

## 2017-06-28 MED ORDER — CYCLOBENZAPRINE HCL 10 MG PO TABS: 10.0000 mg | ORAL_TABLET | Freq: Three times a day (TID) | ORAL | 0 refills | Status: AC | PRN

## 2017-06-28 NOTE — Discharge Summary (Signed)
Physician Discharge Summary  Patient ID: Faith Scott MRN: [id number] DOB/AGE: [birth year] 74 y.o.  Admit date: 06/25/2017 Discharge date: 06/28/2017  Admission Diagnoses:  Spondylolisthesis of lumbar region  Discharge Diagnoses:  Same Active Problems:   Spondylolisthesis of lumbar region   Discharged Condition: Stable  Hospital Course:  Faith Scott is a 76 y.o. female who was admitted for the below procedure. There were no post operative complications. At time of discharge, pain was well controlled, ambulating with Pt/OT, tolerating po, voiding normal. Ready for discharge.  Treatments: Surgery Bilateral L3-4 Laminotomy/foraminotomies to decompress the bilateral L3 and L4 nerve roots(the work required to do this was in addition to the work required to do the posterior lumbar interbody fusion because of the patient's spinal stenosis, facet arthropathy. Etc. requiring a wide decompression of the nerve roots.); L3-4 transforaminal lumbar interbody fusion with local morselized autograft bone and Kinnex graft extender; insertion of interbody prosthesis at L3-4 (globus peek expandable interbody prosthesis); posterior nonsegmental instrumentation from L3 to L4 with globus titanium pedicle screws and rods; posterior lateral arthrodesis at L3-4 with local morselized autograft bone and Kinnex bone graft extender.   Discharge Exam: Blood pressure 135/73, pulse 81, temperature 98.2 F (36.8 C), temperature source Oral, resp. rate 18, height 5' 3.5" (1.613 m), weight 90.7 kg (199 lb 14.4 oz), SpO2 99 %. Awake, alert, oriented Speech fluent, appropriate CN grossly intact 5/5 BUE/BLE Wound c/d/i  Disposition: 01-Home or Self Care  Discharge Instructions    Call MD for:  difficulty breathing, headache or visual disturbances    Complete by:  As directed    Call MD for:  persistant dizziness or light-headedness    Complete by:  As directed    Call MD for:  redness, tenderness, or signs  of infection (pain, swelling, redness, odor or green/yellow discharge around incision site)    Complete by:  As directed    Call MD for:  severe uncontrolled pain    Complete by:  As directed    Call MD for:  temperature >100.4    Complete by:  As directed    Diet general    Complete by:  As directed    Driving Restrictions    Complete by:  As directed    Do not drive until given clearance.   Increase activity slowly    Complete by:  As directed    Lifting restrictions    Complete by:  As directed    Do not lift anything >10lbs. Avoid bending and twisting in awkward positions. Avoid bending at the back.     Allergies as of 06/28/2017      Reactions   Metoprolol    FATIGUE   Calan [verapamil] Other (See Comments)   Hair loss   Lisinopril Cough      Medication List    STOP taking these medications   methocarbamol 500 MG tablet Commonly known as:  ROBAXIN     TAKE these medications   amLODipine 5 MG tablet Commonly known as:  NORVASC Take 5 mg by mouth daily.   BIOTIN PO Take 1 tablet by mouth daily.   CALCIUM PO Take 600 mg by mouth daily.   cyclobenzaprine 10 MG tablet Commonly known as:  FLEXERIL Take 1 tablet (10 mg total) by mouth 3 (three) times daily as needed for muscle spasms.   DHEA PO Take 5 mg by mouth daily.   losartan-hydrochlorothiazide 100-25 MG tablet Commonly known as:  HYZAAR Take 0.5 tablets by mouth  daily.   MAGNESIUM PO Take 250 mg by mouth daily.   NON FORMULARY Apply 1 application topically 2 (two) times daily. Estriol 0.3% / DMAE 5% / Hyaluronate 60 gm   oxybutynin 5 MG tablet Commonly known as:  DITROPAN Take 5 mg by mouth daily.   oxyCODONE-acetaminophen 7.5-325 MG tablet Commonly known as:  PERCOCET Take 1 tablet by mouth every 4 (four) hours as needed for severe pain.   psyllium 0.52 g capsule Commonly known as:  REGULOID Take 0.52 g by mouth 2 (two) times daily.   ranitidine 300 MG tablet Commonly known as:   ZANTAC Take 300 mg by mouth daily.   VISINE OP Place 1-2 drops into both eyes as needed (for dry eyes).   vitamin B-12 1000 MCG tablet Commonly known as:  CYANOCOBALAMIN Take 1,000 mcg by mouth daily.            Discharge Care Instructions        Start     Ordered   06/28/17 0000  cyclobenzaprine (FLEXERIL) 10 MG tablet  3 times daily PRN     06/28/17 0849   06/28/17 0000  oxyCODONE-acetaminophen (PERCOCET) 7.5-325 MG tablet  Every 4 hours PRN     06/28/17 0849   06/28/17 0000  Increase activity slowly     06/28/17 0849   06/28/17 0000  Driving Restrictions    Comments:  Do not drive until given clearance.   06/28/17 0849   06/28/17 0000  Lifting restrictions    Comments:  Do not lift anything >10lbs. Avoid bending and twisting in awkward positions. Avoid bending at the back.   06/28/17 0849   06/28/17 0000  Diet general     06/28/17 0849   06/28/17 0000  Call MD for:  temperature >100.4     06/28/17 0849   06/28/17 0000  Call MD for:  severe uncontrolled pain     06/28/17 0849   06/28/17 0000  Call MD for:  redness, tenderness, or signs of infection (pain, swelling, redness, odor or green/yellow discharge around incision site)     06/28/17 0849   06/28/17 0000  Call MD for:  difficulty breathing, headache or visual disturbances     06/28/17 0849   06/28/17 0000  Call MD for:  persistant dizziness or light-headedness     06/28/17 0849     Follow-up Information    Tressie Stalker, MD. Schedule an appointment as soon as possible for a visit today.   Specialty:  Neurosurgery Contact information: [address] [place] [address] [phone]           Signed: Alyson Ingles 06/28/2017, 8:50 AM

## 2017-06-28 NOTE — Progress Notes (Signed)
Physical Therapy Treatment Patient Details Name: Faith Scott MRN: [id number] DOB: September 20, 1941 Today's Date: 06/28/2017    History of Present Illness Pt is a 76 y/o female s/p decompressive laminectomies at L3-L4 and TLIF L3-L4. PMH including but not limited to HTN and R TKA in 2016.    PT Comments    Pt presents with improved independence, able to ascend/descend most of a flight of step using modified technique.  Pain fairly well controlled and minimally limiting this session.  Pt ready to d/c home.   Follow Up Recommendations  No PT follow up     Equipment Recommendations  None recommended by PT    Recommendations for Other Services       Precautions / Restrictions Precautions Precautions: Fall;Back Precaution Comments: Pt able to recall 3/3 back precautions.  Required Braces or Orthoses: Spinal Brace Spinal Brace: Lumbar corset;Applied in sitting position    Mobility  Bed Mobility Overal bed mobility: Needs Assistance Bed Mobility: Rolling;Sit to Sidelying Rolling: Supervision       Sit to sidelying: Supervision General bed mobility comments: HOB 20 degrees, pt has adjustable bed. Discussed relative risk with log rolling; pt instructed to use matress edge as bed rail substitute  Transfers Overall transfer level: Needs assistance Equipment used: Rolling walker (2 wheeled) Transfers: Sit to/from Stand Sit to Stand: Supervision         General transfer comment: supervision for safety  Ambulation/Gait Ambulation/Gait assistance: Supervision Ambulation Distance (Feet): 200 Feet Assistive device: Rolling walker (2 wheeled) Gait Pattern/deviations: Step-through pattern;Decreased stride length Gait velocity: decreased Gait velocity interpretation: Below normal speed for age/gender General Gait Details: slow, steady gait with no instability or LOB, supervision for safety   Stairs Stairs: Yes   Stair Management: One rail Left;Step to pattern;Sideways Number  of Stairs: 10 General stair comments: encouragement to boost confidence, no physical assist needed, hands on for safety only; instructed on step up exercise to incr quad strength  Wheelchair Mobility    Modified Rankin (Stroke Patients Only)       Balance           Standing balance support: Bilateral upper extremity supported;During functional activity Standing balance-Leahy Scale: Fair Standing balance comment: static standing without UE support                             Cognition Arousal/Alertness: Awake/alert Behavior During Therapy: WFL for tasks assessed/performed Overall Cognitive Status: Within Functional Limits for tasks assessed                                        Exercises      General Comments General comments (skin integrity, edema, etc.): instructed to complete 3 sets of 3-5 step-up each leg, at 3 times per day; incr walking time at home      Pertinent Vitals/Pain Pain Assessment: 0-10 Pain Score: 6  Pain Location: back  Pain Descriptors / Indicators: Sore Pain Intervention(s): Limited activity within patient's tolerance;Repositioned;Monitored during session    Home Living                      Prior Function            PT Goals (current goals can now be found in the care plan section) Acute Rehab PT Goals Patient Stated Goal: return home, decrease pain PT  Goal Formulation: All assessment and education complete, DC therapy Progress towards PT goals: Goals met/education completed, patient discharged from PT    Frequency    Min 5X/week      PT Plan Current plan remains appropriate    Co-evaluation              AM-PAC PT "6 Clicks" Daily Activity  Outcome Measure  Difficulty turning over in bed (including adjusting bedclothes, sheets and blankets)?: None Difficulty moving from lying on back to sitting on the side of the bed? : A Little Difficulty sitting down on and standing up from a chair  with arms (e.g., wheelchair, bedside commode, etc,.)?: None Help needed moving to and from a bed to chair (including a wheelchair)?: None Help needed walking in hospital room?: None Help needed climbing 3-5 steps with a railing? : A Little 6 Click Score: 22    End of Session Equipment Utilized During Treatment: Gait belt;Back brace Activity Tolerance: Patient tolerated treatment well Patient left: in chair;with call bell/phone within reach Nurse Communication: Mobility status PT Visit Diagnosis: Other abnormalities of gait and mobility (R26.89);Pain Pain - Right/Left: Right Pain - part of body: Hip     Time: 1150-1230 PT Time Calculation (min) (ACUTE ONLY): 40 min  Charges:  $Gait Training: 23-37 mins $Therapeutic Activity: 8-22 mins                    G Codes:       Faith Scott, PT, DPT, MS Board Certified Geriatric Clinical Specialist   Faith Scott 06/28/2017, 12:38 PM

## 2017-06-28 NOTE — Care Management Note (Signed)
Case Management Note Original Note Created Kermit Balo, RN 06/26/2017, 2:01 PM   Patient Details  Name: Faith Scott MRN: [id number] Date of Birth: Aug 05, 1941  Subjective/Objective:  Pt underwent: DECOMPRESSION LAMINECTOMY LUMBAR THREE- LUMBAR FOUR, INSTRUMENTATION WITH PEDICLE SCREWS AND ROD PLACEMENT, INTERBODY PROSTHESIS, POSTERIOR LATERAL ARTHRODESIS. She is from home with her spouse.                  Action/Plan: No f/u per PT. Awaiting OT eval. CM following for d/c needs, physician orders.   Expected Discharge Date:  06/28/17               Expected Discharge Plan:  Home/Self Care  In-House Referral:  NA  Discharge planning Services  CM Consult  Post Acute Care Choice:  NA Choice offered to:  NA  DME Arranged:    DME Agency:     HH Arranged:    HH Agency:     Status of Service:  Completed, signed off  If discussed at [employer] of Stay Meetings, dates discussed:    Discharge Disposition: home/self care   Additional Comments:  06/28/17- 1000- Rien Marland RN, CM- per PT eval no f/u recommendations made and no DME recommendations- pt to return home.   Darrold Span, RN 06/28/2017, 10:03 AM

## 2017-06-28 NOTE — Progress Notes (Signed)
Pt discharging at this time with husband taking all personal belongings. Surgical dressing site clean dry and intact. Brace on and aligned. IV discontinued, dry dressing applied. Discharge instructions along with prescriptions provided with verbal understanding. No noted distress.

## 2017-07-02 MED FILL — Sodium Chloride IV Soln 0.9%: INTRAVENOUS | Qty: 1000 | Status: AC

## 2017-07-02 MED FILL — Heparin Sodium (Porcine) Inj 1000 Unit/ML: INTRAMUSCULAR | Qty: 30 | Status: AC
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
## 2017-09-25 ENCOUNTER — Emergency Department (HOSPITAL_BASED_OUTPATIENT_CLINIC_OR_DEPARTMENT_OTHER)
Admission: EM | Admit: 2017-09-25 | Discharge: 2017-09-25 | Disposition: A | Discharge status: Home / Self Care | Payer: Medicare Other | Attending: Emergency Medicine | Admitting: Emergency Medicine

## 2017-09-25 ENCOUNTER — Emergency Department (HOSPITAL_BASED_OUTPATIENT_CLINIC_OR_DEPARTMENT_OTHER): Payer: Medicare Other

## 2017-09-25 ENCOUNTER — Other Ambulatory Visit: Payer: Self-pay

## 2017-09-25 ENCOUNTER — Encounter (HOSPITAL_BASED_OUTPATIENT_CLINIC_OR_DEPARTMENT_OTHER): Payer: Self-pay | Admitting: Emergency Medicine

## 2017-09-25 DIAGNOSIS — I129 Hypertensive chronic kidney disease with stage 1 through stage 4 chronic kidney disease, or unspecified chronic kidney disease: Secondary | ICD-10-CM | POA: Diagnosis not present

## 2017-09-25 DIAGNOSIS — R2242 Localized swelling, mass and lump, left lower limb: Secondary | ICD-10-CM | POA: Diagnosis not present

## 2017-09-25 DIAGNOSIS — Z87891 Personal history of nicotine dependence: Secondary | ICD-10-CM | POA: Insufficient documentation

## 2017-09-25 DIAGNOSIS — Z96651 Presence of right artificial knee joint: Secondary | ICD-10-CM | POA: Diagnosis not present

## 2017-09-25 DIAGNOSIS — M79605 Pain in left leg: Secondary | ICD-10-CM

## 2017-09-25 DIAGNOSIS — Z79899 Other long term (current) drug therapy: Secondary | ICD-10-CM | POA: Diagnosis not present

## 2017-09-25 DIAGNOSIS — E039 Hypothyroidism, unspecified: Secondary | ICD-10-CM | POA: Diagnosis not present

## 2017-09-25 DIAGNOSIS — N182 Chronic kidney disease, stage 2 (mild): Secondary | ICD-10-CM | POA: Diagnosis not present

## 2017-09-25 NOTE — ED Triage Notes (Signed)
Patient was seen at an Urgent care and sent her for further evaluation of a possible DVT to her left leg - patient has noted swelling to her left leg. Patient denies any SOB or Chest pain

## 2017-09-25 NOTE — ED Provider Notes (Signed)
[facility] EMERGENCY DEPARTMENT Provider Note   CSN: [id number] Arrival date & time: 09/25/17  1633     History   Chief Complaint Chief Complaint  Patient presents with  . Leg Pain    HPI Faith Scott is a 76 y.o. female.  HPI   [facility] with concern flr left lower extremity pain and swelling. Pain behind knee an dswelling to top of left foot which comes and goes. Helped by elevation.  Thought it may have been complications of back surgery and called back surgeon who did not feel it was a complication of that but questioned DVT.  Reports not red.  Has fam hx of DVT.  Pain started about 2-3 weeks ago. Pain back of knee and back of upper leg, slight cramping in calf.  Has had int numbness to foot since prior to the surgery and has tingling in hand that attributes to upper spine dislocation which she doesn't want surgery for.  Surg end of September, sees Dr. Lovell Sheehan Feb 1st.  No fevers. No chest pain or dyspnea.   Past Medical History:  Diagnosis Date  . Anxiety   . Chronic kidney disease    Stage 2 kidney disease-  RESOLVED   . Depression   . GERD (gastroesophageal reflux disease)   . Hepatitis    age 8; pt unsure of what kind  . Hypercholesteremia   . Hypertension   . Hypothyroid    resolved as of 03/07/15 no medications anymore  . Insufficiency fracture of medial femoral condyle (HCC) 06/26/2015  . Mitral valve prolapse   . Pneumonia    hx of  . Primary localized osteoarthritis of right knee   . Shortness of breath dyspnea    with slight exertion  . Sinusitis   . Sleep apnea    Did have sleep apnea, does not have it since having UVULOPALATOPHARYNGOPLASTY   . Urgency of urination    frequency of urination as well    Patient Active Problem List   Diagnosis Date Noted  . Spondylolisthesis of lumbar region 06/25/2017  . Insufficiency fracture of medial femoral condyle (HCC) 06/26/2015  . DJD (degenerative joint disease) of knee  06/25/2015  . Primary localized osteoarthritis of right knee   . Anxiety   . Hypercholesteremia   . Hypertension     Past Surgical History:  Procedure Laterality Date  . COLONOSCOPY    . EYE SURGERY Bilateral 2013   cataracts   . KNEE ARTHROSCOPY W/ MENISCECTOMY Right   . SHOULDER SURGERY Left   . SHOULDER SURGERY Right   . TOE SURGERY Bilateral    Had toes straightened and bunions removed  . TONSILLECTOMY    . 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 TUBAL LIGATION    . UVULOPALATOPHARYNGOPLASTY     no CPAP needed now    OB History    No data available       Home Medications    Prior to Admission medications   Medication Sig Start Date End Date Taking? Authorizing Provider  amLODipine (NORVASC) 5 MG tablet Take 5 mg by mouth daily.    [provider]  BIOTIN PO Take 1 tablet by mouth daily.    [provider]  CALCIUM PO Take 600 mg by mouth daily.  [provider]  cyclobenzaprine (FLEXERIL) 10 MG tablet Take 1 tablet (10 mg total) by mouth 3 (three) times daily as needed for muscle spasms. 06/28/17   Costella, Darci CurrentVincent J, PA-C  losartan-hydrochlorothiazide (HYZAAR) 100-25 MG per tablet Take 0.5 tablets by mouth daily.  10/13/13   [provider]  MAGNESIUM PO Take 250 mg by mouth daily.     [provider]  NON FORMULARY Apply 1 application topically 2 (two) times daily. Estriol 0.3% / DMAE 5% / Hyaluronate 60 gm    [provider]  Nutritional Supplements (DHEA PO) Take 5 mg by mouth daily.    [provider]  oxybutynin (DITROPAN) 5 MG tablet Take 5 mg by mouth daily.     [provider]  oxyCODONE-acetaminophen (PERCOCET) 7.5-325 MG tablet Take 1 tablet by mouth every 4 (four) hours as needed for severe  pain. 06/28/17   Costella, Darci CurrentVincent J, PA-C  psyllium (REGULOID) 0.52 g capsule Take 0.52 g by mouth 2 (two) times daily.    [provider]  ranitidine (ZANTAC) 300 MG tablet Take 300 mg by mouth daily.    [provider]  Tetrahydrozoline HCl (VISINE OP) Place 1-2 drops into both eyes as needed (for dry eyes).    [provider]  vitamin B-12 (CYANOCOBALAMIN) 1000 MCG tablet Take 1,000 mcg by mouth daily.    [provider]    Family History Family History  Problem Relation Age of Onset  . Hypertension Mother   . Stroke Mother   . Clotting disorder Mother        mother had a stoke postopertively  . Clotting disorder Sister        sister died at age 479 of a postoperative blood clot    Social History Social History   Tobacco Use  . Smoking status: Former Games developermoker  . Smokeless tobacco: Never Used  Substance Use Topics  . Alcohol use: Yes    Alcohol/week: 0.0 oz    Comment: 2 times a month; socially  . Drug use: No     Allergies   Benicar [olmesartan]; Metoprolol; Simvastatin; Calan [verapamil]; and Lisinopril   Review of Systems Review of Systems  Constitutional: Negative for fever.  HENT: Negative for sore throat.   Eyes: Negative for visual disturbance.  Respiratory: Negative for cough and shortness of breath.   Cardiovascular: Negative for chest pain.  Gastrointestinal: Negative for abdominal pain, nausea and vomiting.  Genitourinary: Negative for difficulty urinating.  Musculoskeletal: Positive for arthralgias and myalgias. Negative for back pain and neck pain.  Skin: Negative for rash.  Neurological: Negative for syncope, weakness, numbness and headaches.     Physical Exam Updated Vital Signs BP (!) 168/54   Pulse 84   Temp (!) 97.5 F (36.4 C) (Oral)   Resp 17   Ht 5\' 3"  (1.6 m)   Wt 84.4 kg (186 lb)   SpO2 99%   BMI 32.95 kg/m   Physical Exam  Constitutional: She is oriented to person, place, and time. She appears  well-developed and well-nourished. No distress.  HENT:  Head: Normocephalic and atraumatic.  Eyes: Conjunctivae and EOM are normal.  Neck: Normal range of motion.  Cardiovascular: Normal rate, regular rhythm, normal heart sounds and intact distal pulses. Exam reveals no gallop and no friction rub.  No murmur heard. Pulmonary/Chest: Effort normal and breath sounds normal. No respiratory distress. She has no wheezes. She has no rales.  Abdominal: Soft. She exhibits no distension. There is no  tenderness. There is no guarding.  Musculoskeletal: She exhibits tenderness (popliteal fossa, calf). She exhibits no edema.  Neurological: She is alert and oriented to person, place, and time. She has normal strength. No sensory deficit. GCS eye subscore is 4. GCS verbal subscore is 5. GCS motor subscore is 6.  Skin: Skin is warm and dry. No rash noted. She is not diaphoretic. No erythema.  Nursing note and vitals reviewed.    ED Treatments / Results  Labs (all labs ordered are listed, but only abnormal results are displayed) Labs Reviewed - No data to display  EKG  EKG Interpretation None       Radiology [place] Venous Img Lower Unilateral Left  Result Date: 09/25/2017 CLINICAL DATA:  Left foot pain and swelling for 3 weeks EXAM: LEFT LOWER EXTREMITY VENOUS DOPPLER ULTRASOUND TECHNIQUE: Gray-scale sonography with graded compression, as well as color Doppler and duplex ultrasound were performed to evaluate the lower extremity deep venous systems from the level of the common femoral vein and including the common femoral, femoral, profunda femoral, popliteal and calf veins including the posterior tibial, peroneal and gastrocnemius veins when visible. The superficial great saphenous vein was also interrogated. Spectral Doppler was utilized to evaluate flow at rest and with distal augmentation maneuvers in the common femoral, femoral and popliteal veins. COMPARISON:  None. FINDINGS: Contralateral Common  Femoral Vein: Respiratory phasicity is normal and symmetric with the symptomatic side. No evidence of thrombus. Normal compressibility. Common Femoral Vein: No evidence of thrombus. Normal compressibility, respiratory phasicity and response to augmentation. Saphenofemoral Junction: No evidence of thrombus. Normal compressibility and flow on color Doppler imaging. Profunda Femoral Vein: No evidence of thrombus. Normal compressibility and flow on color Doppler imaging. Femoral Vein: No evidence of thrombus. Normal compressibility, respiratory phasicity and response to augmentation. Popliteal Vein: No evidence of thrombus. Normal compressibility, respiratory phasicity and response to augmentation. Calf Veins: No evidence of thrombus. Normal compressibility and flow on color Doppler imaging. Superficial Great Saphenous Vein: No evidence of thrombus. Normal compressibility. Venous Reflux:  None. Other Findings:  None. IMPRESSION: No evidence of deep venous thrombosis. Electronically Signed   By: Sherian ReinWei-Chen  Lin M.D.   On: 09/25/2017 18:41    Procedures Procedures (including critical care time)  Medications Ordered in ED Medications - No data to display   Initial Impression / Assessment and Plan / ED Course  I have reviewed the triage vital signs and the nursing notes.  Pertinent labs & imaging results that were available during my care of the patient were reviewed by me and considered in my medical decision making (see chart for details).     76yo female with history of lumbar surgery in September [facility]. DVT study negative. Normal pulses, no sign arterial thrombus. No sign of cellulitis or abscess. Possible muscular strain, possible radicular symptoms. No red flags of back pain, no fever, numbness or weakness. Rec PCP follow up.  Patient discharged in stable condition with understanding of reasons to return.   Final Clinical Impressions(s) / ED Diagnoses   Final  diagnoses:  Left leg pain    ED Discharge Orders    None       Alvira MondaySchlossman, Junie Engram, MD 09/26/17 1251

## 2019-06-25 IMAGING — US US EXTREM LOW VENOUS*L*
1 series · 13 of 24 positions shown · non-contrast
Comparison: None.

CLINICAL DATA: Left foot pain and swelling for 3 weeks



[Series 1: us extrem low venous*left* · 0.08mm/px · 13 of 27 slices shown]
[im 1/27]
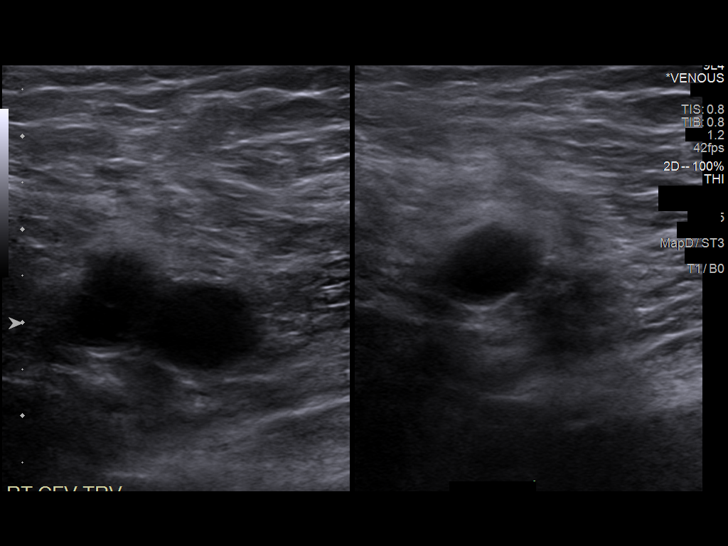
[im 3/27]
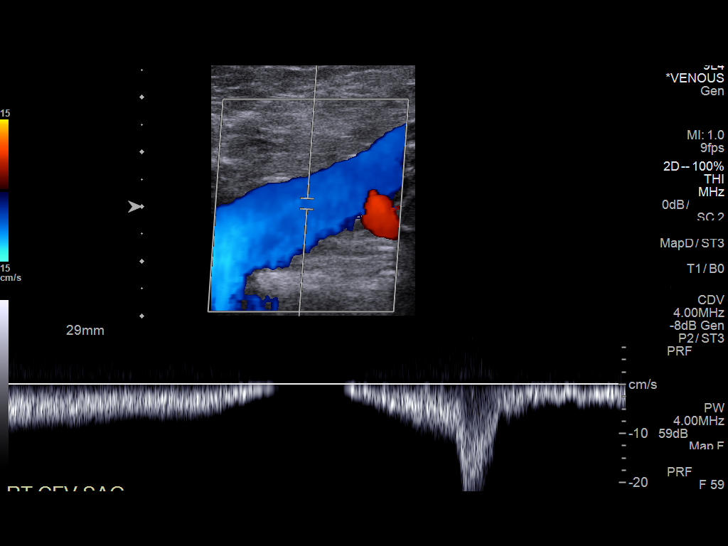
[im 5/27]
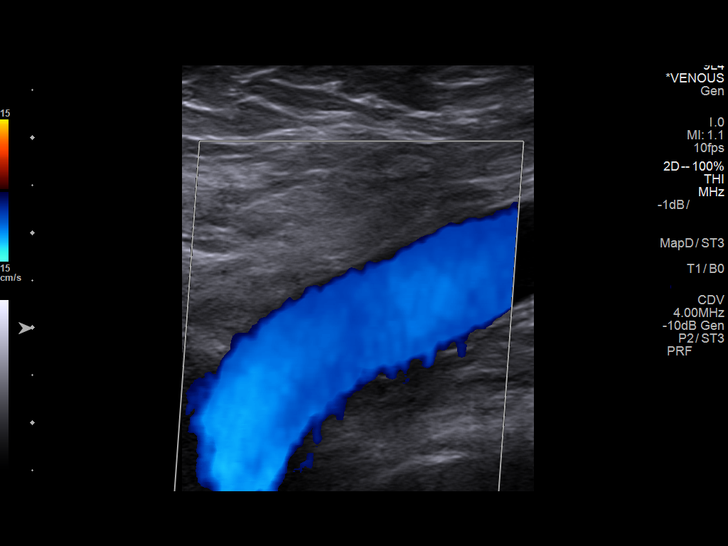
[im 7/27]
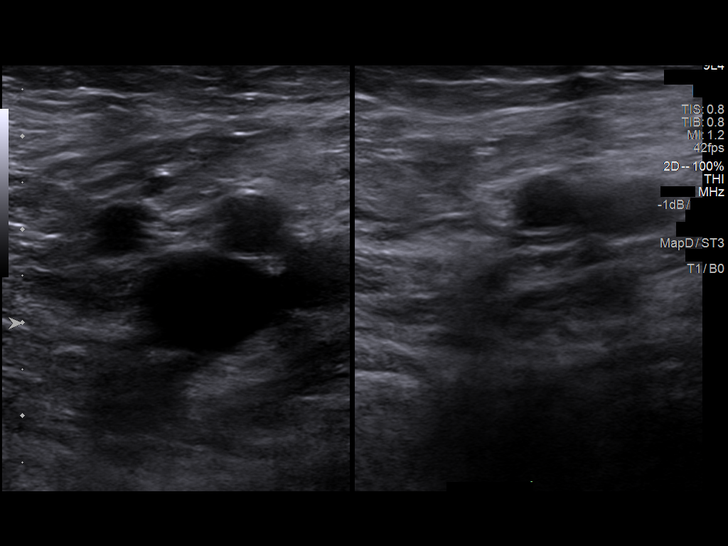
[im 10/27]
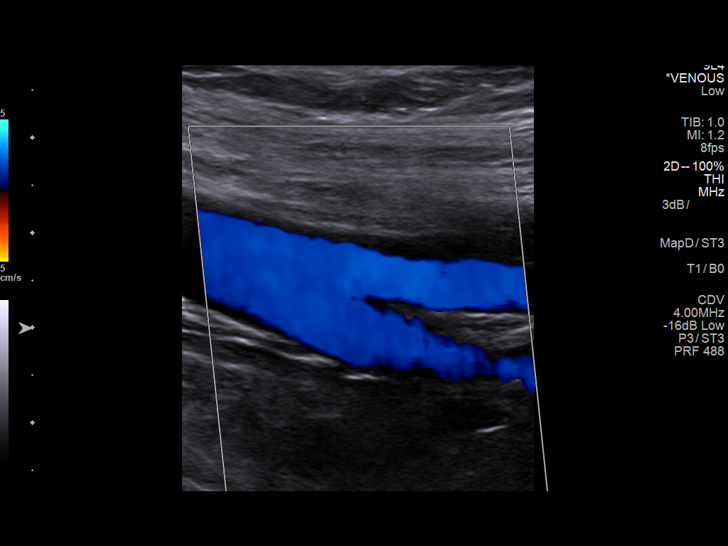
[im 12/27]
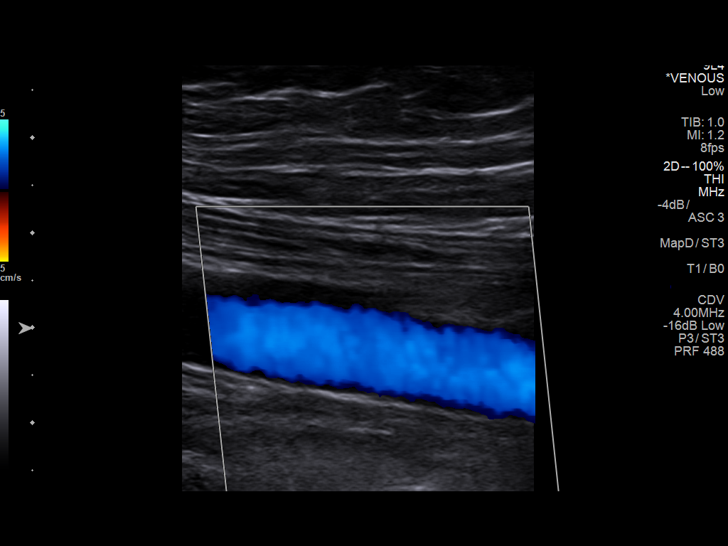
[im 14/27]
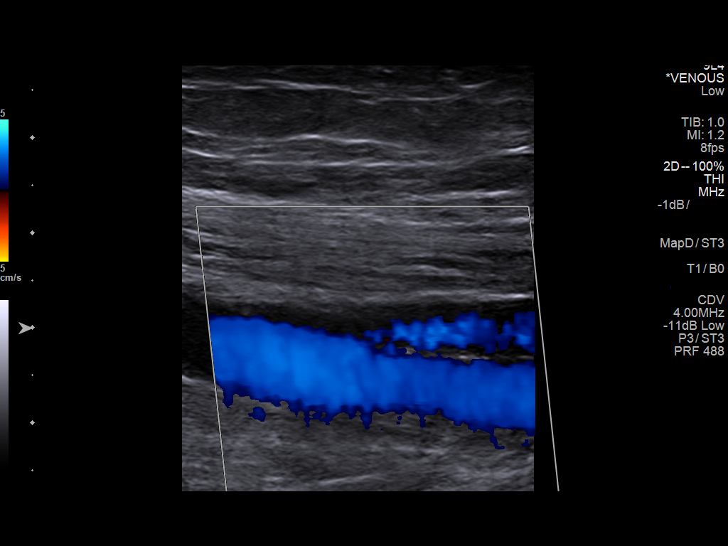
[im 15/27]
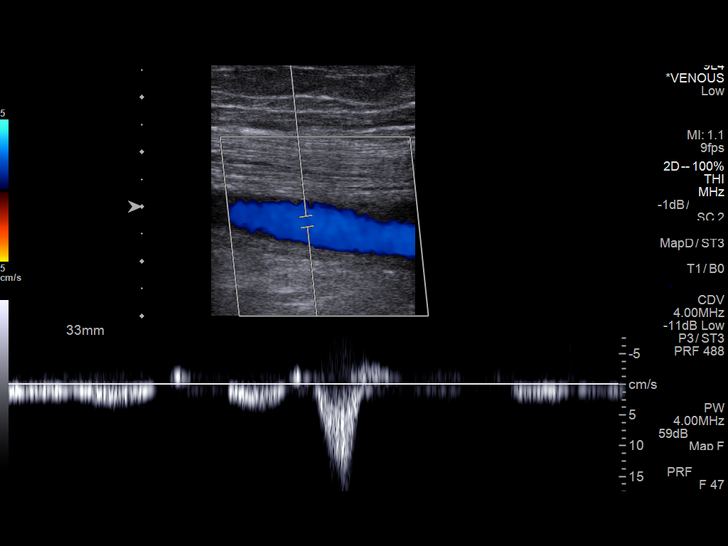
[im 17/27]
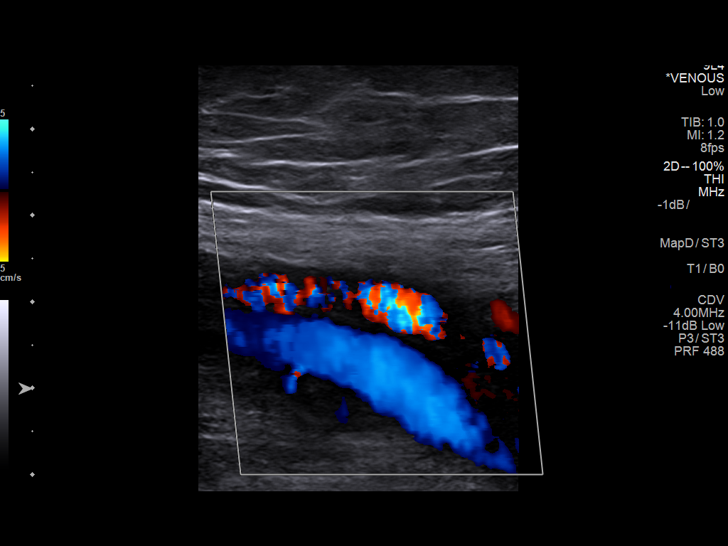
[im 20/27]
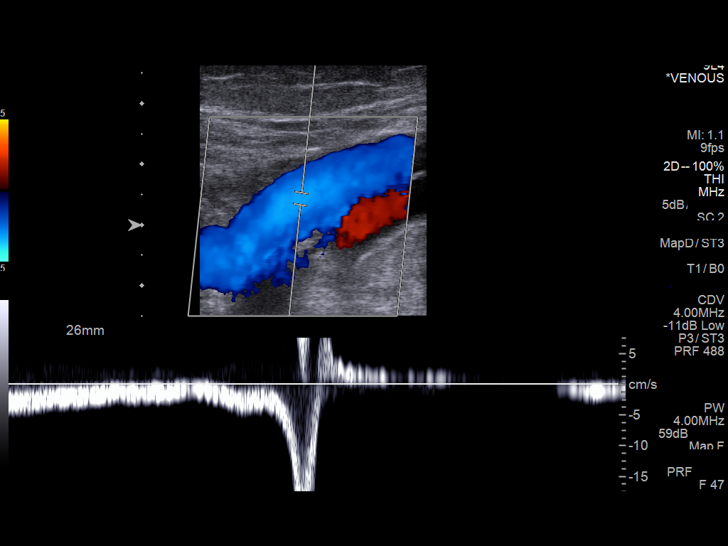
[im 22/27]
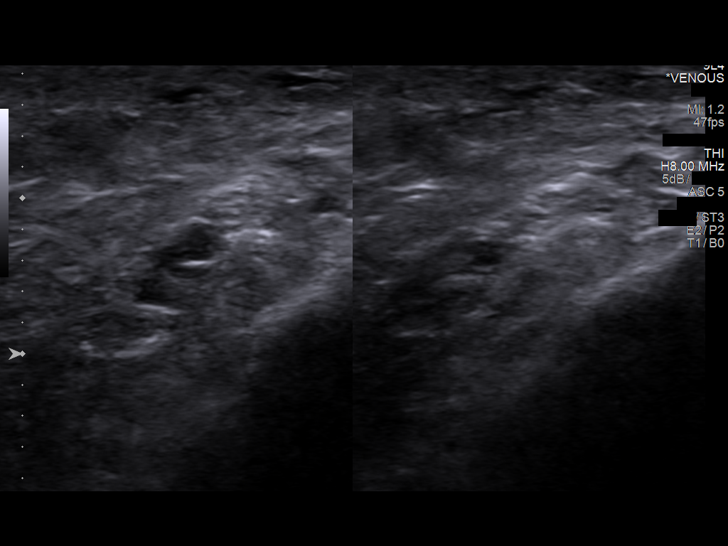
[im 24/27]
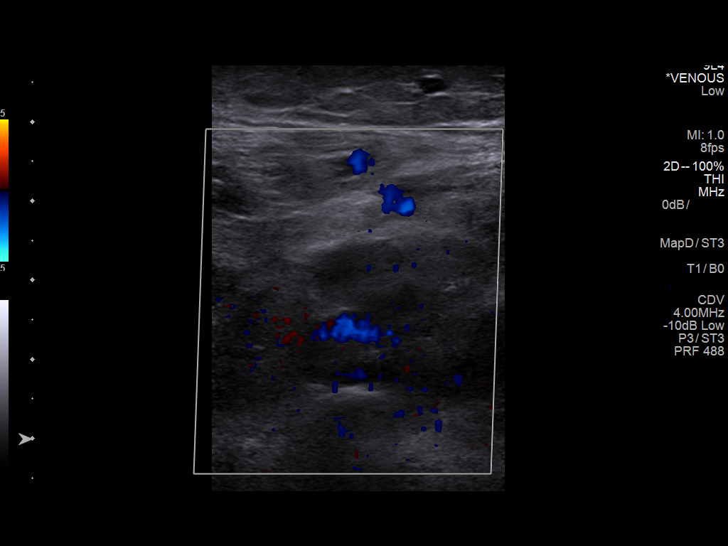
[im 27/27]
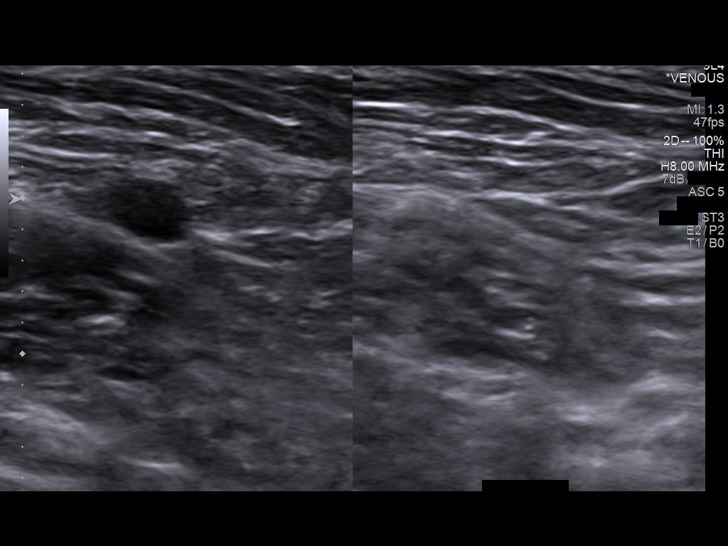

[13 of 24 positions shown; findings below may reference images not displayed]

FINDINGS: Contralateral Common Femoral Vein: Respiratory phasicity is normal
and symmetric with the symptomatic side. No evidence of thrombus.
Normal compressibility.

Common Femoral Vein: No evidence of thrombus. Normal
compressibility, respiratory phasicity and response to augmentation.

Saphenofemoral Junction: No evidence of thrombus. Normal
compressibility and flow on color Doppler imaging.

Profunda Femoral Vein: No evidence of thrombus. Normal
compressibility and flow on color Doppler imaging.

Femoral Vein: No evidence of thrombus. Normal compressibility,
respiratory phasicity and response to augmentation.

Popliteal Vein: No evidence of thrombus. Normal compressibility,
respiratory phasicity and response to augmentation.

Calf Veins: No evidence of thrombus. Normal compressibility and flow
on color Doppler imaging.

Superficial Great Saphenous Vein: No evidence of thrombus. Normal
compressibility.

Venous Reflux:  None.

Other Findings:  None.
IMPRESSION: No evidence of deep venous thrombosis.
